# Patient Record
Sex: Female | Born: 1985 | Race: White | Hispanic: No | Smoking: Never smoker
Health system: Southern US, Community
[De-identification: ages and names within clinical notes are randomized; demographics above are authoritative.]

## PROBLEM LIST (undated history)

## (undated) DIAGNOSIS — F329 Major depressive disorder, single episode, unspecified: Secondary | ICD-10-CM

## (undated) DIAGNOSIS — N83209 Unspecified ovarian cyst, unspecified side: Secondary | ICD-10-CM

## (undated) DIAGNOSIS — M797 Fibromyalgia: Secondary | ICD-10-CM

## (undated) DIAGNOSIS — IMO0002 Reserved for concepts with insufficient information to code with codable children: Secondary | ICD-10-CM

## (undated) DIAGNOSIS — R000 Tachycardia, unspecified: Secondary | ICD-10-CM

## (undated) DIAGNOSIS — K219 Gastro-esophageal reflux disease without esophagitis: Secondary | ICD-10-CM

## (undated) DIAGNOSIS — F32A Depression, unspecified: Secondary | ICD-10-CM

## (undated) DIAGNOSIS — M549 Dorsalgia, unspecified: Secondary | ICD-10-CM

## (undated) DIAGNOSIS — M7071 Other bursitis of hip, right hip: Secondary | ICD-10-CM

## (undated) DIAGNOSIS — F419 Anxiety disorder, unspecified: Secondary | ICD-10-CM

## (undated) DIAGNOSIS — IMO0001 Reserved for inherently not codable concepts without codable children: Secondary | ICD-10-CM

## (undated) DIAGNOSIS — G8929 Other chronic pain: Secondary | ICD-10-CM

## (undated) DIAGNOSIS — M7072 Other bursitis of hip, left hip: Secondary | ICD-10-CM

## (undated) HISTORY — PX: KNEE DISLOCATION SURGERY: SHX689

## (undated) HISTORY — PX: WISDOM TOOTH EXTRACTION: SHX21

## (undated) HISTORY — PX: ADENOIDECTOMY: SUR15

## (undated) HISTORY — PX: TONSILLECTOMY: SUR1361

## (undated) HISTORY — PX: KNEE ARTHROSCOPY: SHX127

---
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
## 2006-01-07 ENCOUNTER — Emergency Department (HOSPITAL_COMMUNITY): Admission: EM | Admit: 2006-01-07 | Discharge: 2006-01-07 | Payer: Self-pay | Admitting: Emergency Medicine

## 2006-03-18 ENCOUNTER — Emergency Department (HOSPITAL_COMMUNITY): Admission: EM | Admit: 2006-03-18 | Discharge: 2006-03-18 | Payer: Self-pay | Admitting: Emergency Medicine

## 2006-03-24 ENCOUNTER — Emergency Department (HOSPITAL_COMMUNITY): Admission: EM | Admit: 2006-03-24 | Discharge: 2006-03-24 | Payer: Self-pay | Admitting: Emergency Medicine

## 2006-04-16 ENCOUNTER — Emergency Department (HOSPITAL_COMMUNITY): Admission: EM | Admit: 2006-04-16 | Discharge: 2006-04-16 | Payer: Self-pay | Admitting: Emergency Medicine

## 2007-05-12 ENCOUNTER — Emergency Department (HOSPITAL_COMMUNITY): Admission: EM | Admit: 2007-05-12 | Discharge: 2007-05-12 | Payer: Self-pay | Admitting: Emergency Medicine

## 2007-06-07 ENCOUNTER — Emergency Department (HOSPITAL_COMMUNITY): Admission: EM | Admit: 2007-06-07 | Discharge: 2007-06-07 | Payer: Self-pay | Admitting: Emergency Medicine

## 2007-07-20 ENCOUNTER — Emergency Department (HOSPITAL_COMMUNITY): Admission: EM | Admit: 2007-07-20 | Discharge: 2007-07-20 | Payer: Self-pay | Admitting: Emergency Medicine

## 2007-07-30 ENCOUNTER — Encounter: Payer: Self-pay | Admitting: Family

## 2007-09-11 ENCOUNTER — Encounter (HOSPITAL_COMMUNITY): Admission: RE | Admit: 2007-09-11 | Discharge: 2007-10-29 | Payer: Self-pay | Admitting: Family Medicine

## 2007-09-15 ENCOUNTER — Emergency Department (HOSPITAL_COMMUNITY): Admission: EM | Admit: 2007-09-15 | Discharge: 2007-09-15 | Payer: Self-pay | Admitting: Emergency Medicine

## 2007-09-16 ENCOUNTER — Emergency Department (HOSPITAL_COMMUNITY): Admission: EM | Admit: 2007-09-16 | Discharge: 2007-09-16 | Payer: Self-pay | Admitting: Emergency Medicine

## 2007-11-11 ENCOUNTER — Emergency Department (HOSPITAL_BASED_OUTPATIENT_CLINIC_OR_DEPARTMENT_OTHER): Admission: EM | Admit: 2007-11-11 | Discharge: 2007-11-11 | Payer: Self-pay | Admitting: Emergency Medicine

## 2007-11-19 ENCOUNTER — Emergency Department (HOSPITAL_COMMUNITY): Admission: EM | Admit: 2007-11-19 | Discharge: 2007-11-19 | Payer: Self-pay | Admitting: Emergency Medicine

## 2007-11-25 ENCOUNTER — Emergency Department (HOSPITAL_BASED_OUTPATIENT_CLINIC_OR_DEPARTMENT_OTHER): Admission: EM | Admit: 2007-11-25 | Discharge: 2007-11-25 | Payer: Self-pay | Admitting: Emergency Medicine

## 2007-11-29 ENCOUNTER — Emergency Department (HOSPITAL_COMMUNITY): Admission: EM | Admit: 2007-11-29 | Discharge: 2007-11-29 | Payer: Self-pay | Admitting: Emergency Medicine

## 2007-12-10 ENCOUNTER — Emergency Department (HOSPITAL_BASED_OUTPATIENT_CLINIC_OR_DEPARTMENT_OTHER): Admission: EM | Admit: 2007-12-10 | Discharge: 2007-12-10 | Payer: Self-pay | Admitting: Emergency Medicine

## 2007-12-12 ENCOUNTER — Emergency Department (HOSPITAL_COMMUNITY): Admission: EM | Admit: 2007-12-12 | Discharge: 2007-12-12 | Payer: Self-pay | Admitting: Emergency Medicine

## 2007-12-15 ENCOUNTER — Emergency Department (HOSPITAL_COMMUNITY): Admission: EM | Admit: 2007-12-15 | Discharge: 2007-12-15 | Payer: Self-pay | Admitting: Emergency Medicine

## 2007-12-18 ENCOUNTER — Emergency Department (HOSPITAL_BASED_OUTPATIENT_CLINIC_OR_DEPARTMENT_OTHER): Admission: EM | Admit: 2007-12-18 | Discharge: 2007-12-18 | Payer: Self-pay | Admitting: Emergency Medicine

## 2008-01-04 ENCOUNTER — Emergency Department (HOSPITAL_COMMUNITY): Admission: EM | Admit: 2008-01-04 | Discharge: 2008-01-04 | Payer: Self-pay | Admitting: Emergency Medicine

## 2008-04-25 ENCOUNTER — Emergency Department (HOSPITAL_BASED_OUTPATIENT_CLINIC_OR_DEPARTMENT_OTHER): Admission: EM | Admit: 2008-04-25 | Discharge: 2008-04-25 | Payer: Self-pay | Admitting: Emergency Medicine

## 2009-06-01 ENCOUNTER — Emergency Department (HOSPITAL_BASED_OUTPATIENT_CLINIC_OR_DEPARTMENT_OTHER): Admission: EM | Admit: 2009-06-01 | Discharge: 2009-06-01 | Payer: Self-pay | Admitting: Emergency Medicine

## 2009-07-11 ENCOUNTER — Emergency Department (HOSPITAL_BASED_OUTPATIENT_CLINIC_OR_DEPARTMENT_OTHER): Admission: EM | Admit: 2009-07-11 | Discharge: 2009-07-12 | Payer: Self-pay | Admitting: Emergency Medicine

## 2009-08-09 ENCOUNTER — Emergency Department (HOSPITAL_COMMUNITY): Admission: EM | Admit: 2009-08-09 | Discharge: 2009-08-09 | Payer: Self-pay | Admitting: Emergency Medicine

## 2009-08-17 ENCOUNTER — Emergency Department (HOSPITAL_COMMUNITY): Admission: EM | Admit: 2009-08-17 | Discharge: 2009-08-17 | Payer: Self-pay | Admitting: Emergency Medicine

## 2009-12-02 ENCOUNTER — Emergency Department (HOSPITAL_COMMUNITY): Admission: EM | Admit: 2009-12-02 | Discharge: 2009-12-03 | Payer: Self-pay | Admitting: Emergency Medicine

## 2009-12-14 ENCOUNTER — Emergency Department (HOSPITAL_BASED_OUTPATIENT_CLINIC_OR_DEPARTMENT_OTHER): Admission: EM | Admit: 2009-12-14 | Discharge: 2009-12-14 | Payer: Self-pay | Admitting: Emergency Medicine

## 2009-12-21 ENCOUNTER — Ambulatory Visit: Payer: Self-pay | Admitting: Radiology

## 2009-12-21 ENCOUNTER — Emergency Department (HOSPITAL_BASED_OUTPATIENT_CLINIC_OR_DEPARTMENT_OTHER)
Admission: EM | Admit: 2009-12-21 | Discharge: 2009-12-21 | Payer: Self-pay | Source: Home / Self Care | Admitting: Emergency Medicine

## 2009-12-26 ENCOUNTER — Emergency Department (HOSPITAL_BASED_OUTPATIENT_CLINIC_OR_DEPARTMENT_OTHER): Admission: EM | Admit: 2009-12-26 | Discharge: 2009-12-26 | Payer: Self-pay | Admitting: Emergency Medicine

## 2009-12-27 ENCOUNTER — Ambulatory Visit: Payer: Self-pay | Admitting: Diagnostic Radiology

## 2009-12-27 ENCOUNTER — Ambulatory Visit: Admission: RE | Admit: 2009-12-27 | Discharge: 2009-12-27 | Payer: Self-pay | Admitting: Emergency Medicine

## 2010-01-05 ENCOUNTER — Emergency Department (HOSPITAL_BASED_OUTPATIENT_CLINIC_OR_DEPARTMENT_OTHER): Admission: EM | Admit: 2010-01-05 | Discharge: 2010-01-06 | Payer: Self-pay | Admitting: Emergency Medicine

## 2010-01-18 ENCOUNTER — Emergency Department (HOSPITAL_BASED_OUTPATIENT_CLINIC_OR_DEPARTMENT_OTHER): Admission: EM | Admit: 2010-01-18 | Discharge: 2010-01-18 | Payer: Self-pay | Admitting: Emergency Medicine

## 2010-02-03 ENCOUNTER — Emergency Department (HOSPITAL_BASED_OUTPATIENT_CLINIC_OR_DEPARTMENT_OTHER)
Admission: EM | Admit: 2010-02-03 | Discharge: 2010-02-03 | Payer: Self-pay | Source: Home / Self Care | Admitting: Emergency Medicine

## 2010-02-05 ENCOUNTER — Ambulatory Visit: Payer: Self-pay | Admitting: Family

## 2010-02-05 DIAGNOSIS — E0590 Thyrotoxicosis, unspecified without thyrotoxic crisis or storm: Secondary | ICD-10-CM | POA: Insufficient documentation

## 2010-02-05 DIAGNOSIS — M5430 Sciatica, unspecified side: Secondary | ICD-10-CM

## 2010-02-05 DIAGNOSIS — F329 Major depressive disorder, single episode, unspecified: Secondary | ICD-10-CM

## 2010-02-05 DIAGNOSIS — F3289 Other specified depressive episodes: Secondary | ICD-10-CM | POA: Insufficient documentation

## 2010-02-09 ENCOUNTER — Emergency Department (HOSPITAL_BASED_OUTPATIENT_CLINIC_OR_DEPARTMENT_OTHER)
Admission: EM | Admit: 2010-02-09 | Discharge: 2010-02-09 | Payer: Self-pay | Source: Home / Self Care | Admitting: Emergency Medicine

## 2010-02-09 ENCOUNTER — Encounter: Payer: Self-pay | Admitting: Family

## 2010-02-10 ENCOUNTER — Emergency Department (HOSPITAL_COMMUNITY)
Admission: EM | Admit: 2010-02-10 | Discharge: 2010-02-10 | Payer: Self-pay | Source: Home / Self Care | Admitting: Emergency Medicine

## 2010-02-12 ENCOUNTER — Emergency Department (HOSPITAL_BASED_OUTPATIENT_CLINIC_OR_DEPARTMENT_OTHER)
Admission: EM | Admit: 2010-02-12 | Discharge: 2010-02-13 | Payer: Self-pay | Source: Home / Self Care | Admitting: Emergency Medicine

## 2010-03-10 ENCOUNTER — Telehealth: Payer: Self-pay | Admitting: Family

## 2010-03-15 ENCOUNTER — Encounter: Payer: Self-pay | Admitting: Family

## 2010-03-15 ENCOUNTER — Telehealth: Payer: Self-pay | Admitting: Family

## 2010-03-15 DIAGNOSIS — R87619 Unspecified abnormal cytological findings in specimens from cervix uteri: Secondary | ICD-10-CM | POA: Insufficient documentation

## 2010-03-17 ENCOUNTER — Encounter: Payer: Self-pay | Admitting: Family

## 2010-03-19 ENCOUNTER — Telehealth: Payer: Self-pay | Admitting: Family

## 2010-03-21 ENCOUNTER — Encounter: Payer: Self-pay | Admitting: Neurosurgery

## 2010-04-01 NOTE — Letter (Signed)
Summary: Out of Work  Adult nurse at [employer]. [address]   [place], [place] [address]   Phone: [phone]  Fax: [phone]    February 05, 2010   Employee:  Aggie Cosier    To Whom It May Concern:   For Medical reasons, please excuse the above named employee from work for the following dates:  02/04/10  If you need additional information, please feel free to contact our office.         Sincerely,    Lemont Fillers FNP

## 2010-04-01 NOTE — Letter (Signed)
Summary: Historic Patient File  Historic Patient File   Imported By: Roselle Locus 03/17/2010 08:06:08  _____________________________________________________________________  External Attachment:    Type:   Image     Comment:   External Document

## 2010-04-01 NOTE — Progress Notes (Signed)
Summary: Records received--[facility]  Phone Note Other Incoming   Summary of Call: Records received from [facility] and forwarded to Provider for review.  Initial call taken by: Mervin Kung CMA Duncan Dull),  March 10, 2010 4:08 PM

## 2010-04-01 NOTE — Progress Notes (Signed)
Summary: GYN referral  Phone Note Outgoing Call   Summary of Call: Please call patient and let her know that I have reviewed her records from   [facility] and see that she had an abnormal Pap smear in 2007.  I want to make sure that she is being followed regularly by a GYN.  If not, she needs to see one.  I will be happy to make referral.  Please let me know.  Initial call taken by: Lemont Fillers FNP,  March 15, 2010 12:26 PM  Follow-up for Phone Call        Notified pt. She states she is not currently seeing a GYN in the area. Reports last pap was done at [facility] in ?11/2008 and was told she had endometriosis. She would like a referral to a local GYN. Nicki Guadalajara Fergerson CMA (AAMA)  March 15, 2010 1:32 PM

## 2010-04-01 NOTE — Letter (Signed)
Summary: Unable to See Patient/[facility]  Unable to See Patient/[facility]   Imported By: Lanelle Bal 02/26/2010 07:51:48  _____________________________________________________________________  External Attachment:    Type:   Image     Comment:   External Document

## 2010-04-01 NOTE — Assessment & Plan Note (Signed)
Summary: new to est/mhf-- Rm 4   Vital Signs:  Patient profile:   25 year old female Height:      64 inches Weight:      118 pounds BMI:     20.33 Temp:     97.7 degrees F oral Pulse rate:   78 / minute Pulse rhythm:   regular Resp:     16 per minute BP sitting:   100 / 78  (right arm) Cuff size:   regular  Vitals Entered By: Mervin Kung CMA Duncan Dull) (February 05, 2010 11:18 AM) CC: Pt new to establish care. States she has hx of DDD, L5, S1 buldging disc by MRI 7/09 with nerve damage on the left. States she has bursitis of the left hip. Is Patient Diabetic? No Pain Assessment Patient in pain? yes        CC:  Pt new to establish care. States she has hx of DDD, L5, and S1 buldging disc by MRI 7/09 with nerve damage on the left. States she has bursitis of the left hip.Marland Kitchen  History of Present Illness: Adriana Galvan  is a 25 year old female who presents today to establish care.  She reports that she was previously following at [facility] (near [place]).  She lost her medicaid when she was 22 and has not been seeing a primary care since that time.  Her cheif complaint today is back pain.  1) Low back pain- Pt reports that symptoms started in 2008.  She reportedly had an MRI- [facility] in 2009 which noted L5/S1 bulging.   Progressively worsened.  Last few months both legs have started hurting.  Works as a Conservation officer, nature at [employer].  Mom and GM both his history of spinal problems, fusions.  2) Hyperthyroid-  Was told that she has history of hyperthyroidism.  Never treated.  She had a thyroid scan.    3) Bursitis left hip- diagnosed in November.    4)Depression- denies history of spending sprees, mania.  She has been more down lately since her husband was incarcerated.     Preventive Screening-Counseling & Management  Alcohol-Tobacco     Alcohol drinks/day: 0     Smoking Status: never  Caffeine-Diet-Exercise     Caffeine use/day: none     Does Patient  Exercise: no  Allergies (verified): 1)  ! Pcn 2)  ! Benadryl 3)  ! Zithromax 4)  ! Reglan 5)  ! Tramadol Hcl 6)  ! Hydrocodone  Past History:  Past Medical History: DDD Buldging disc L5, S1 by MRI--08/2007 Bursitis left hip--11/09 tachycardia Hx of Hyperthyroidism--untreated  Past Surgical History: right knee surgery (fractured knee cap)-- 2003,  Dr Mckinley Jewel right knee Arthroscopy-- 2005 Tonsillectomy & Addenoidectomy--2006 Wisdom teeth extraction  Family History: Mother-- bipolar (manic), congenial heart defect?, spinal fusion Father-- bipolar, treated MGM-- Titanium cage, bars in spine PGF-- heart disease Was on wellbutrin for a while Only child, no children  Social History: Occupation: Conservation officer, nature,  Married- husband is incarcerated Never Smoked Alcohol use-no Regular exercise-no Smoking Status:  never Caffeine use/day:  none Does Patient Exercise:  no  Review of Systems       Constitutional: Denies Fever ENT:  Denies nasal congestion or sore throat. Resp: Denies cough CV:  Denies Chest Pain, occasional palpitations GI:  Denies nausea or vomitting- she continues prilosec GU: Denies dysuria Lymphatic: Denies lymphadenopathy Musculoskeletal:  + sciatic pain radiates down the left leg. Skin:  Denies Rashes Psychiatric:  see HPI Neuro: +  numbness big toe on left foot.     Physical Exam  General:  Well-developed,well-nourished,in no acute distress; alert,appropriate and cooperative throughout examination Head:  Normocephalic and atraumatic without obvious abnormalities. No apparent alopecia or balding. Ears:  External ear exam shows no significant lesions or deformities.  Otoscopic examination reveals clear canals, tympanic membranes are intact bilaterally without bulging, retraction, inflammation or discharge. Hearing is grossly normal bilaterally. Neck:  No deformities, masses, or tenderness noted. Lungs:  Normal respiratory effort, chest expands  symmetrically. Lungs are clear to auscultation, no crackles or wheezes. Heart:  Normal rate and regular rhythm. S1 and S2 normal without gallop, murmur, click, rub or other extra sounds. Neurologic:  bilateral LE 5/5 strength.  bilateral patellar reflexes 3+gait normal.   Skin:  Intact without suspicious lesions or rashes Psych:  Oriented X3 and memory intact for recent and remote.  Became tearful when narcotics were not provided   Impression & Recommendations:  Problem # 1:  SCIATICA, LEFT (ICD-724.3) Patient is requesting oxycodone and Lyrica today.  I advised patient that her symptoms are consistent with a chronic pain situation and we do not treat chronic pain here.  I did adviser that I am happy to make a referral to pain management. She wishes to proceed with referral today.  I have recommended NSAIDS, PT, and referral to neurosurgery.  She tells me that she cannot afford PT or NS referral at this time. She also tells me that she has not responded to ESI's in the past.  She is hopeful that she will have insurance in the near future.   I did call her pharmacy to see is she is currently taking any narcotics from different providers and they told me that she has had percocet filled 10/31, 11/9, and 11/21 by 3 different providers (Dr. March Rummage, Dr Narda Bonds, Dr. Lorre Nick respectively).  Will request MRI and primary care records.   Her updated medication list for this problem includes:    Meloxicam 7.5 Mg Tabs (Meloxicam) ..... One tablet by mouth once daily  Orders: Pain Clinic Referral (Pain)  Problem # 2:  HYPERTHYROIDISM (ICD-242.90) Assessment: Comment Only Review of Echart notes that patient had a thyroid scan which was performed in 2009.  This was felt to be consistent with graves disease.  Will check TSH today.  If low will add on free t3/t4.   Orders: TLB-TSH (Thyroid Stimulating Hormone) (84443-TSH)  Problem # 3:  DEPRESSION (ICD-311) Assessment: Unchanged Husband was recently  incarcerated. I suspect that her depression is largely situational.  Will monitor for now.  Consider medication if symptoms worsen or do not improve.   Complete Medication List: 1)  Prilosec Otc 20 Mg Tbec (Omeprazole magnesium) .... One tablet by mouth once daily 2)  Meloxicam 7.5 Mg Tabs (Meloxicam) .... One tablet by mouth once daily  Patient Instructions: 1)  Please follow up in 1 month.  2)  Most patients (90%) with low back pain will improve with time (2-6 weeks). Keep active but avoid activities that are painful. Apply moist heat and/or ice to lower back several times a day. 3)  Please complete your lab work downstairs today. Prescriptions: MELOXICAM 7.5 MG TABS (MELOXICAM) one tablet by mouth once daily  #15 x 0   Entered and Authorized by:   Lemont Fillers FNP   Signed by:   Lemont Fillers FNP on 02/05/2010   Method used:   Electronically to  [facility]  [address] [phone]* (retail)       [address]       [place], [place]  [address]       Ph: [phone] or [phone]       Fax: [phone]   RxID:   [id number] MELOXICAM 7.5 MG TABS (MELOXICAM) one tablet by mouth once daily  #15 x 0   Entered and Authorized by:   Lemont Fillers FNP   Signed by:   Lemont Fillers FNP on 02/05/2010   Method used:   Print then Give to Patient   RxID:   [id number]    Orders Added: 1)  TLB-TSH (Thyroid Stimulating Hormone) [32951-OAC] 2)  Pain Clinic Referral [Pain] 3)  New Patient Level II [99202]   Immunization History:  Influenza Immunization History:    Influenza:  historical (11/28/2008)   Contraindications/Deferment of Procedures/Staging:    Test/Procedure: FLU VAX    Reason for deferment: patient declined   Immunization History:  Influenza Immunization History:    Influenza:  Historical (11/28/2008)  Current Allergies (reviewed today): ! PCN ! BENADRYL ! ZITHROMAX ! REGLAN ! TRAMADOL HCL ! HYDROCODONE   Preventive  Care Screening  Last Tetanus Booster:    Date:  11/28/2008    Results:  Historical   Pap Smear:    Date:  11/28/2008    Results:  normal

## 2010-04-01 NOTE — Progress Notes (Signed)
Summary: MRI from [facility] Note Call from Patient   Summary of Call: MRI received from [facility] and forwarded to Provider for review. Adriana Galvan CMA Duncan Dull)  March 19, 2010 1:48 PM

## 2010-04-01 NOTE — Miscellaneous (Signed)
  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

## 2010-05-05 ENCOUNTER — Emergency Department (HOSPITAL_BASED_OUTPATIENT_CLINIC_OR_DEPARTMENT_OTHER)
Admission: EM | Admit: 2010-05-05 | Discharge: 2010-05-05 | Disposition: A | Discharge status: Home / Self Care | Payer: Self-pay | Attending: Emergency Medicine | Admitting: Emergency Medicine

## 2010-05-05 DIAGNOSIS — G8929 Other chronic pain: Secondary | ICD-10-CM | POA: Insufficient documentation

## 2010-05-05 DIAGNOSIS — M549 Dorsalgia, unspecified: Secondary | ICD-10-CM | POA: Insufficient documentation

## 2010-05-05 DIAGNOSIS — K219 Gastro-esophageal reflux disease without esophagitis: Secondary | ICD-10-CM | POA: Insufficient documentation

## 2010-05-05 LAB — PREGNANCY, URINE: Preg Test, Ur: NEGATIVE

## 2010-05-05 LAB — URINALYSIS, ROUTINE W REFLEX MICROSCOPIC
Glucose, UA: NEGATIVE mg/dL
Nitrite: NEGATIVE
Protein, ur: NEGATIVE mg/dL
pH: 5.5 (ref 5.0–8.0)

## 2010-05-11 LAB — URINALYSIS, ROUTINE W REFLEX MICROSCOPIC
Bilirubin Urine: NEGATIVE
Nitrite: NEGATIVE
Protein, ur: NEGATIVE mg/dL
Specific Gravity, Urine: 1.018 (ref 1.005–1.030)
Urobilinogen, UA: 0.2 mg/dL (ref 0.0–1.0)

## 2010-05-11 LAB — PREGNANCY, URINE: Preg Test, Ur: NEGATIVE

## 2010-05-12 LAB — URINALYSIS, ROUTINE W REFLEX MICROSCOPIC
Bilirubin Urine: NEGATIVE
Bilirubin Urine: NEGATIVE
Glucose, UA: NEGATIVE mg/dL
Ketones, ur: NEGATIVE mg/dL
Ketones, ur: NEGATIVE mg/dL
Nitrite: NEGATIVE
Protein, ur: NEGATIVE mg/dL
Protein, ur: NEGATIVE mg/dL
Urobilinogen, UA: 0.2 mg/dL (ref 0.0–1.0)
pH: 5.5 (ref 5.0–8.0)

## 2010-05-12 LAB — DIFFERENTIAL
Basophils Relative: 1 % (ref 0–1)
Eosinophils Relative: 0 % (ref 0–5)
Lymphocytes Relative: 18 % (ref 12–46)
Lymphs Abs: 1.8 10*3/uL (ref 0.7–4.0)
Monocytes Absolute: 0.5 10*3/uL (ref 0.1–1.0)
Monocytes Absolute: 0.5 10*3/uL (ref 0.1–1.0)
Monocytes Relative: 7 % (ref 3–12)
Neutro Abs: 4.1 10*3/uL (ref 1.7–7.7)
Neutro Abs: 7.7 10*3/uL (ref 1.7–7.7)

## 2010-05-12 LAB — COMPREHENSIVE METABOLIC PANEL
AST: 18 U/L (ref 0–37)
Albumin: 4.4 g/dL (ref 3.5–5.2)
Albumin: 4.5 g/dL (ref 3.5–5.2)
Alkaline Phosphatase: 41 U/L (ref 39–117)
BUN: 11 mg/dL (ref 6–23)
BUN: 14 mg/dL (ref 6–23)
Calcium: 10.1 mg/dL (ref 8.4–10.5)
Creatinine, Ser: 0.7 mg/dL (ref 0.4–1.2)
GFR calc Af Amer: >60 mL/min (ref >60)
GFR calc non Af Amer: >60 mL/min (ref >60)
Potassium: 4.4 mEq/L (ref 3.5–5.1)
Sodium: 143 mEq/L (ref 135–145)
Total Protein: 7.6 g/dL (ref 6.0–8.3)

## 2010-05-12 LAB — CBC
HCT: 45.9 % (ref 36.0–46.0)
Hemoglobin: 15.3 g/dL — ABNORMAL HIGH (ref 12.0–15.0)
MCH: 30.9 pg (ref 26.0–34.0)
MCHC: 33.4 g/dL (ref 30.0–36.0)
MCHC: 34.6 g/dL (ref 30.0–36.0)
Platelets: 151 10*3/uL (ref 150–400)
RBC: 5.07 MIL/uL (ref 3.87–5.11)
WBC: 7 10*3/uL (ref 4.0–10.5)

## 2010-05-12 LAB — LIPASE, BLOOD: Lipase: 42 U/L (ref 23–300)

## 2010-05-12 LAB — WET PREP, GENITAL: Clue Cells Wet Prep HPF POC: NONE SEEN

## 2010-05-12 LAB — PREGNANCY, URINE: Preg Test, Ur: NEGATIVE

## 2010-05-12 LAB — URINE MICROSCOPIC-ADD ON

## 2010-05-12 LAB — GC/CHLAMYDIA PROBE AMP, GENITAL
Chlamydia, DNA Probe: NEGATIVE
GC Probe Amp, Genital: NEGATIVE

## 2010-05-17 ENCOUNTER — Telehealth: Payer: Self-pay | Admitting: Family

## 2010-05-18 LAB — URINALYSIS, ROUTINE W REFLEX MICROSCOPIC
Leukocytes, UA: NEGATIVE
Protein, ur: NEGATIVE mg/dL
Specific Gravity, Urine: 1.018 (ref 1.005–1.030)
Urobilinogen, UA: 1 mg/dL (ref 0.0–1.0)

## 2010-05-18 LAB — URINE MICROSCOPIC-ADD ON

## 2010-05-19 ENCOUNTER — Encounter: Payer: Self-pay | Admitting: Obstetrics & Gynecology

## 2010-05-20 ENCOUNTER — Encounter: Payer: Self-pay | Admitting: Obstetrics and Gynecology

## 2010-05-25 ENCOUNTER — Emergency Department (INDEPENDENT_AMBULATORY_CARE_PROVIDER_SITE_OTHER): Payer: Self-pay

## 2010-05-25 ENCOUNTER — Emergency Department (HOSPITAL_BASED_OUTPATIENT_CLINIC_OR_DEPARTMENT_OTHER)
Admission: EM | Admit: 2010-05-25 | Discharge: 2010-05-26 | Disposition: A | Discharge status: Home / Self Care | Payer: Self-pay | Attending: Emergency Medicine | Admitting: Emergency Medicine

## 2010-05-25 DIAGNOSIS — G8929 Other chronic pain: Secondary | ICD-10-CM | POA: Insufficient documentation

## 2010-05-25 DIAGNOSIS — F3289 Other specified depressive episodes: Secondary | ICD-10-CM | POA: Insufficient documentation

## 2010-05-25 DIAGNOSIS — M79609 Pain in unspecified limb: Secondary | ICD-10-CM | POA: Insufficient documentation

## 2010-05-25 DIAGNOSIS — F329 Major depressive disorder, single episode, unspecified: Secondary | ICD-10-CM | POA: Insufficient documentation

## 2010-05-25 DIAGNOSIS — K219 Gastro-esophageal reflux disease without esophagitis: Secondary | ICD-10-CM | POA: Insufficient documentation

## 2010-05-27 ENCOUNTER — Encounter: Payer: Self-pay | Admitting: Obstetrics and Gynecology

## 2010-05-27 NOTE — Progress Notes (Signed)
----   Converted from flag ---- ---- 05/17/2010 12:32 PM, Darral Dash wrote:   spoke with pt she has appt March 29   [facility]     ---- 04/26/2010 8:54 PM, Lemont Fillers FNP wrote: Could you pls check that patient had GYN apt scheduled? (and verify that she went to apt).  Thanks- Tauren Delbuono ------------------------------

## 2010-06-05 ENCOUNTER — Emergency Department (HOSPITAL_COMMUNITY)
Admission: EM | Admit: 2010-06-05 | Discharge: 2010-06-05 | Disposition: A | Discharge status: Home / Self Care | Payer: Self-pay | Attending: Emergency Medicine | Admitting: Emergency Medicine

## 2010-06-05 ENCOUNTER — Emergency Department (HOSPITAL_COMMUNITY): Payer: Self-pay

## 2010-06-05 DIAGNOSIS — K219 Gastro-esophageal reflux disease without esophagitis: Secondary | ICD-10-CM | POA: Insufficient documentation

## 2010-06-05 DIAGNOSIS — L84 Corns and callosities: Secondary | ICD-10-CM | POA: Insufficient documentation

## 2010-06-05 DIAGNOSIS — Z79899 Other long term (current) drug therapy: Secondary | ICD-10-CM | POA: Insufficient documentation

## 2010-06-05 DIAGNOSIS — M79609 Pain in unspecified limb: Secondary | ICD-10-CM | POA: Insufficient documentation

## 2010-06-05 DIAGNOSIS — F341 Dysthymic disorder: Secondary | ICD-10-CM | POA: Insufficient documentation

## 2010-06-09 ENCOUNTER — Emergency Department (HOSPITAL_COMMUNITY)
Admission: EM | Admit: 2010-06-09 | Discharge: 2010-06-09 | Disposition: A | Discharge status: Home / Self Care | Payer: Self-pay | Attending: Emergency Medicine | Admitting: Emergency Medicine

## 2010-06-09 DIAGNOSIS — M79609 Pain in unspecified limb: Secondary | ICD-10-CM | POA: Insufficient documentation

## 2010-06-09 DIAGNOSIS — F341 Dysthymic disorder: Secondary | ICD-10-CM | POA: Insufficient documentation

## 2010-06-16 ENCOUNTER — Encounter: Payer: Self-pay | Admitting: Obstetrics and Gynecology

## 2010-06-23 ENCOUNTER — Emergency Department (HOSPITAL_BASED_OUTPATIENT_CLINIC_OR_DEPARTMENT_OTHER)
Admission: EM | Admit: 2010-06-23 | Discharge: 2010-06-23 | Disposition: A | Discharge status: Home / Self Care | Payer: Self-pay | Attending: Emergency Medicine | Admitting: Emergency Medicine

## 2010-06-23 DIAGNOSIS — K219 Gastro-esophageal reflux disease without esophagitis: Secondary | ICD-10-CM | POA: Insufficient documentation

## 2010-06-23 DIAGNOSIS — F341 Dysthymic disorder: Secondary | ICD-10-CM | POA: Insufficient documentation

## 2010-06-23 DIAGNOSIS — M25559 Pain in unspecified hip: Secondary | ICD-10-CM | POA: Insufficient documentation

## 2010-06-23 DIAGNOSIS — Z79899 Other long term (current) drug therapy: Secondary | ICD-10-CM | POA: Insufficient documentation

## 2010-06-23 DIAGNOSIS — G8929 Other chronic pain: Secondary | ICD-10-CM | POA: Insufficient documentation

## 2010-07-14 ENCOUNTER — Encounter: Payer: Self-pay | Admitting: Obstetrics and Gynecology

## 2010-07-16 NOTE — Op Note (Signed)
[place]  Patient:    Adriana Galvan, Adriana Galvan Visit Number: [id number] MRN: [id number]          Service Type: DSU Location: [facility] Attending Physician:  Colbert Ewing Dictated by:   Loreta Ave, M.D. Proc. Date: 06/07/01 Admit Date:  06/07/2001                             Operative Report  PREOPERATIVE DIAGNOSIS:  Patellofemoral subluxation and recurrent dislocation, right knee.  POSTOPERATIVE DIAGNOSIS:  Patellofemoral subluxation and recurrent dislocation, right knee, with traumatic chondromalacia and inflamed medial plica.  PROCEDURES: 1. Right knee examination under anesthesia, arthroscopy, chondroplasty of    patella, excision of medial plica. 2. Open medial reefing of retinaculum.  SURGEON:  Loreta Ave, M.D.  ASSISTANT:  Arlys John D. Petrarca, P.A.-C.  ANESTHESIA:  General.  ESTIMATED BLOOD LOSS:  Minimal.  TOURNIQUET TIME:  45 minutes.  SPECIMENS:  None.  CULTURES:  None.  COMPLICATIONS:  None.  DRESSING:  Soft compressive with knee immobilizer.  DESCRIPTION OF PROCEDURE:  The patient was brought to the operating room and after adequate anesthesia had been obtained, both knees examined.  Local ligamentous laxity with subluxable patellas on both sides.  Did not see significant tether laterally.  On the right she could be dislocated laterally with marked attenuation of stretching medial retinaculum.  Tourniquet applied right leg.  Prepped and draped in the usual sterile fashion.  Exsanguinated with elevation and Esmarch, tourniquet inflated to 350 mmHg.  Anteromedial and anterolateral portals.  Arthroscope introduced and knee inspected.  Some grade 2 changes on the patella debrided.  Inflamed medial plica resected.  Marked lateral tracking and lateral subluxation but without significant tethering laterally.  Medial meniscus, medial compartment, lateral meniscus, lateral compartment, cruciate ligaments normal.   After chondroplasty and plica excision, instruments and fluid removed.  Incision made medial to the patella. Skin and subcutaneous tissue divided.  Superficial retinaculum incised next to the patella and retracted.  The deep retinaculum was then taken down from above the patella down to the inferior aspect near the patellar border.   It was then mobilized and reefed 1 cm up on top of the patella to tighten the medial retinaculum and reinforce it.  The retinaculum was then oversewn with Vicryl to do a double layer of reefing.  When this was complete, tracking assessed visually and arthroscopically.  It was markedly improved with normal tracking.  Did not tether up laterally to require lateral release.  I could bring her well past 90 degrees of flexion without undue tension on the reefing, confirming a relatively anatomic positioning.  When this was confirmed, the instruments and fluid were removed.  Portals, incision, and knee injected with Marcaine.  Portals closed with 4-0 nylon.  Incision closed with subcutaneous and subcuticular Vicryl.  Sterile compressive dressing and knee immobilizer applied.  Anesthesia reversed.  Brought to the recovery room. Tolerated the surgery well with no complications. Dictated by:   Loreta Ave, M.D. Attending Physician:  Colbert Ewing DD:  06/07/01 TD:  06/08/01 Job: [id number]

## 2010-07-16 NOTE — Discharge Summary (Signed)
NAME:  Adriana Galvan, Adriana Galvan NO.:  [id number]   MEDICAL RECORD NO.:  [id number]                   PATIENT TYPE:  EMS   LOCATION:  ED                                   FACILITY:  [facility]   PHYSICIAN:  Sheppard Penton. Stacie Acres, M.D.               DATE OF BIRTH:  10/27/1985   DATE OF ADMISSION:  08/06/2003  DATE OF DISCHARGE:                                 DISCHARGE SUMMARY   TIME OF EVALUATION:  2:20 a.m.   COMPLAINT:  Possible medication reaction.   A 25 year old white female recently had arthroscopic knee surgery, took a  Percocet at 11:30 p.m. last night.  Around 12 a.m. developed a sensation of  dizziness, some nausea and not feeling well, some tremors.  Felt lightheaded  and numb all over.  No nausea or vomiting, no abdominal pain or chest pain,  no shortness of breath, feels somewhat better right now.  Still feels  numbness in both arms.  No prior history of the same and nothing that made  it better or worse.   PAST MEDICAL HISTORY:  Recent knee surgery - Dr. Eulah Pont.   MEDICATIONS:  Zyrtec, Percocet, Vicodin, and Wellbutrin which she just  began, had no Wellbutrin for some time, started that as well yesterday.   FAMILY HISTORY:  No chronic diseases.   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   REVIEW OF SYSTEMS:  Other than above, all else is negative.   PHYSICAL EXAMINATION:  Nurses notes are reviewed.  VITAL SIGNS:  Vital signs reviewed.  Temperature 97.2, heart rate 94.  Blood  pressure 98/62, repeated 108/64, repeated 119/67.  GENERAL:  Awake, alert, cooperative, and 5.  Both parents in the room with  her.  Memory, affect, judgment appropriate for age.  Well-developed, well-  nourished, no apparent distress.  HEENT:  Pupils equal, round, and react.  No nystagmus.  Speech clear.  Not  affected by respiratory effort.  No cyanosis.  NECK:  Supple, no JVD, no bruits.  Trachea midline.  HEART:  Regular rate and rhythm without murmurs or  gallops.  LUNGS:  Clear without wheezing, rales or rhonchi.  Chest wall not tender to  palpation.  Good respiratory excursion.  ABDOMEN:  Soft, nontender.  No guarding or rebound.  No peritoneal signs.  SKIN:  Warm and dry.  No rash, no lesions.  EXTREMITIES:  Symmetrical.  No calf tenderness, no pitting edema, and recent  right knee surgery noted with minimal redness and swelling.  NEUROLOGIC:  Grips equal bilaterally.  Cerebellar:  Hand slaps intact  bilaterally.  Dorsiflexor and plantar flexor equal bilaterally.  Strength  equal in all four.  Normal cognitive function.   I discussed with the parents at the bed.  The symptoms sound more like an  adverse reaction to narcotics with the possible addition of the recent use  of Wellbutrin.   PLAN:  Stay on ibuprofen for the next 24 hours.  Use Vicodin and not the  Percocet if needed.  Blood glucose in the ED 108.   DIAGNOSIS:  Mild drug reaction to narcotics, cannot rule out adverse  reaction to recent use of Wellbutrin.                                               Sheppard Penton. Stacie Acres, M.D.    NMM/MEDQ  D:  08/06/2003  T:  08/06/2003  Job:  [id number]

## 2010-07-23 ENCOUNTER — Emergency Department (HOSPITAL_COMMUNITY)
Admission: EM | Admit: 2010-07-23 | Discharge: 2010-07-23 | Disposition: A | Discharge status: Home / Self Care | Payer: Self-pay | Attending: Emergency Medicine | Admitting: Emergency Medicine

## 2010-07-23 DIAGNOSIS — X58XXXA Exposure to other specified factors, initial encounter: Secondary | ICD-10-CM | POA: Insufficient documentation

## 2010-07-23 DIAGNOSIS — M25569 Pain in unspecified knee: Secondary | ICD-10-CM | POA: Insufficient documentation

## 2010-07-24 ENCOUNTER — Emergency Department (HOSPITAL_BASED_OUTPATIENT_CLINIC_OR_DEPARTMENT_OTHER)
Admission: EM | Admit: 2010-07-24 | Discharge: 2010-07-24 | Disposition: A | Discharge status: Home / Self Care | Payer: Self-pay | Attending: Emergency Medicine | Admitting: Emergency Medicine

## 2010-07-24 DIAGNOSIS — L509 Urticaria, unspecified: Secondary | ICD-10-CM | POA: Insufficient documentation

## 2010-07-24 DIAGNOSIS — G8929 Other chronic pain: Secondary | ICD-10-CM | POA: Insufficient documentation

## 2010-07-24 DIAGNOSIS — Z79899 Other long term (current) drug therapy: Secondary | ICD-10-CM | POA: Insufficient documentation

## 2010-07-27 ENCOUNTER — Other Ambulatory Visit (HOSPITAL_COMMUNITY): Payer: Self-pay | Admitting: Orthopedic Surgery

## 2010-07-27 DIAGNOSIS — M25561 Pain in right knee: Secondary | ICD-10-CM

## 2010-07-28 ENCOUNTER — Ambulatory Visit (HOSPITAL_COMMUNITY)
Admission: RE | Admit: 2010-07-28 | Discharge: 2010-07-28 | Disposition: A | Discharge status: Home / Self Care | Payer: Self-pay | Source: Ambulatory Visit | Attending: Orthopedic Surgery | Admitting: Orthopedic Surgery

## 2010-07-28 DIAGNOSIS — M25561 Pain in right knee: Secondary | ICD-10-CM

## 2010-07-28 DIAGNOSIS — M25569 Pain in unspecified knee: Secondary | ICD-10-CM | POA: Insufficient documentation

## 2010-08-21 ENCOUNTER — Emergency Department (HOSPITAL_COMMUNITY)
Admission: EM | Admit: 2010-08-21 | Discharge: 2010-08-21 | Disposition: A | Discharge status: Home / Self Care | Payer: Self-pay | Attending: Emergency Medicine | Admitting: Emergency Medicine

## 2010-08-21 DIAGNOSIS — F341 Dysthymic disorder: Secondary | ICD-10-CM | POA: Insufficient documentation

## 2010-08-21 DIAGNOSIS — G8929 Other chronic pain: Secondary | ICD-10-CM | POA: Insufficient documentation

## 2010-08-21 DIAGNOSIS — Z79899 Other long term (current) drug therapy: Secondary | ICD-10-CM | POA: Insufficient documentation

## 2010-08-21 DIAGNOSIS — M25569 Pain in unspecified knee: Secondary | ICD-10-CM | POA: Insufficient documentation

## 2010-08-21 DIAGNOSIS — K219 Gastro-esophageal reflux disease without esophagitis: Secondary | ICD-10-CM | POA: Insufficient documentation

## 2010-08-26 ENCOUNTER — Emergency Department (HOSPITAL_COMMUNITY)
Admission: EM | Admit: 2010-08-26 | Discharge: 2010-08-27 | Disposition: A | Discharge status: Home / Self Care | Payer: Self-pay | Attending: Emergency Medicine | Admitting: Emergency Medicine

## 2010-08-26 DIAGNOSIS — M25569 Pain in unspecified knee: Secondary | ICD-10-CM | POA: Insufficient documentation

## 2010-08-26 DIAGNOSIS — Z9889 Other specified postprocedural states: Secondary | ICD-10-CM | POA: Insufficient documentation

## 2010-08-26 DIAGNOSIS — G8929 Other chronic pain: Secondary | ICD-10-CM | POA: Insufficient documentation

## 2010-09-04 ENCOUNTER — Emergency Department (HOSPITAL_COMMUNITY): Payer: Self-pay

## 2010-09-04 ENCOUNTER — Emergency Department (HOSPITAL_COMMUNITY)
Admission: EM | Admit: 2010-09-04 | Discharge: 2010-09-04 | Disposition: A | Discharge status: Home / Self Care | Payer: Self-pay | Attending: Emergency Medicine | Admitting: Emergency Medicine

## 2010-09-04 DIAGNOSIS — R05 Cough: Secondary | ICD-10-CM | POA: Insufficient documentation

## 2010-09-04 DIAGNOSIS — R059 Cough, unspecified: Secondary | ICD-10-CM | POA: Insufficient documentation

## 2010-09-04 DIAGNOSIS — H9209 Otalgia, unspecified ear: Secondary | ICD-10-CM | POA: Insufficient documentation

## 2010-09-04 DIAGNOSIS — J069 Acute upper respiratory infection, unspecified: Secondary | ICD-10-CM | POA: Insufficient documentation

## 2010-09-09 ENCOUNTER — Encounter: Payer: Self-pay | Admitting: *Deleted

## 2010-09-09 ENCOUNTER — Emergency Department (HOSPITAL_BASED_OUTPATIENT_CLINIC_OR_DEPARTMENT_OTHER)
Admission: EM | Admit: 2010-09-09 | Discharge: 2010-09-09 | Disposition: A | Discharge status: Home / Self Care | Payer: Self-pay | Attending: Emergency Medicine | Admitting: Emergency Medicine

## 2010-09-09 DIAGNOSIS — R110 Nausea: Secondary | ICD-10-CM

## 2010-09-09 DIAGNOSIS — F341 Dysthymic disorder: Secondary | ICD-10-CM | POA: Insufficient documentation

## 2010-09-09 DIAGNOSIS — E860 Dehydration: Secondary | ICD-10-CM | POA: Insufficient documentation

## 2010-09-09 DIAGNOSIS — R112 Nausea with vomiting, unspecified: Secondary | ICD-10-CM | POA: Insufficient documentation

## 2010-09-09 HISTORY — DX: Reserved for inherently not codable concepts without codable children: IMO0001

## 2010-09-09 HISTORY — DX: Major depressive disorder, single episode, unspecified: F32.9

## 2010-09-09 HISTORY — DX: Gastro-esophageal reflux disease without esophagitis: K21.9

## 2010-09-09 HISTORY — DX: Tachycardia, unspecified: R00.0

## 2010-09-09 HISTORY — DX: Anxiety disorder, unspecified: F41.9

## 2010-09-09 HISTORY — DX: Depression, unspecified: F32.A

## 2010-09-09 LAB — BASIC METABOLIC PANEL WITH GFR
BUN: 11 mg/dL (ref 6–23)
CO2: 22 meq/L (ref 19–32)
Calcium: 9.8 mg/dL (ref 8.4–10.5)
Chloride: 102 meq/L (ref 96–112)
Creatinine, Ser: 0.6 mg/dL (ref 0.50–1.10)
GFR calc Af Amer: >60 mL/min
GFR calc non Af Amer: >60 mL/min
Glucose, Bld: 93 mg/dL (ref 70–99)
Potassium: 3.7 meq/L (ref 3.5–5.1)
Sodium: 137 meq/L (ref 135–145)

## 2010-09-09 LAB — URINALYSIS, ROUTINE W REFLEX MICROSCOPIC
Glucose, UA: NEGATIVE mg/dL
Hgb urine dipstick: NEGATIVE
Protein, ur: NEGATIVE mg/dL
Specific Gravity, Urine: 1.026 (ref 1.005–1.030)
pH: 6 (ref 5.0–8.0)

## 2010-09-09 LAB — CBC
HCT: 40.3 % (ref 36.0–46.0)
Hemoglobin: 13.9 g/dL (ref 12.0–15.0)
MCH: 30 pg (ref 26.0–34.0)
MCHC: 34.5 g/dL (ref 30.0–36.0)
MCV: 86.9 fL (ref 78.0–100.0)
Platelets: 198 K/uL (ref 150–400)
RBC: 4.64 MIL/uL (ref 3.87–5.11)
RDW: 12.9 % (ref 11.5–15.5)
WBC: 9.8 K/uL (ref 4.0–10.5)

## 2010-09-09 LAB — DIFFERENTIAL
Basophils Absolute: 0 K/uL (ref 0.0–0.1)
Basophils Relative: 0 % (ref 0–1)
Eosinophils Absolute: 0 K/uL (ref 0.0–0.7)
Eosinophils Relative: 0 % (ref 0–5)
Lymphocytes Relative: 14 % (ref 12–46)
Lymphs Abs: 1.4 K/uL (ref 0.7–4.0)
Monocytes Absolute: 0.4 K/uL (ref 0.1–1.0)
Monocytes Relative: 5 % (ref 3–12)
Neutro Abs: 8 K/uL — ABNORMAL HIGH (ref 1.7–7.7)
Neutrophils Relative %: 81 % — ABNORMAL HIGH (ref 43–77)

## 2010-09-09 LAB — URINE MICROSCOPIC-ADD ON

## 2010-09-09 MED ORDER — AMOXICILLIN 500 MG PO CAPS: 500.0000 mg | ORAL_CAPSULE | Freq: Three times a day (TID) | ORAL | Status: AC

## 2010-09-09 MED ORDER — SODIUM CHLORIDE 0.9 % IV SOLN
Freq: Once | INTRAVENOUS | Status: AC
  Administered 2010-09-09: 2000 mL via INTRAVENOUS
  Administered 2010-09-09: 19:00:00 via INTRAVENOUS

## 2010-09-09 MED ORDER — ONDANSETRON HCL 4 MG/2ML IJ SOLN
4.0000 mg | Freq: Once | INTRAMUSCULAR | Status: AC
  Administered 2010-09-09: 4 mg via INTRAVENOUS
  Filled 2010-09-09: qty 2

## 2010-09-09 MED ORDER — PROMETHAZINE HCL 25 MG PO TABS: 25.0000 mg | ORAL_TABLET | Freq: Four times a day (QID) | ORAL | Status: AC | PRN

## 2010-09-09 NOTE — Discharge Instructions (Signed)
Your blood work and urinalysis showed that you were dehydrated but were no other significant findings. Please use the Phenergan for nausea. I called the phone numbers listed for [facility]up appointments [facility].

## 2010-09-09 NOTE — ED Provider Notes (Signed)
History     Chief Complaint  Patient presents with  . Nausea    Nausea and vomiting started 2 days ago.     HPI Comments: Patient is a 25 year old female who presents with nausea and vomiting which has been ongoing for 4 days. The symptoms are gradually getting worse. She has been seen recently at [facility] long emergency department for a cough had a chest x-ray which was normal and has been placed on some antitussives. She has had a persistent cough despite this intervention. She denies fever swelling chest pain shortness of breath abdominal pain, back pain, neck pain, headaches, blurred vision, rash, swelling. She has been treated with over-the-counter antitussive medicines without any improvement and continues to cough. Nausea and vomiting is persistent, nothing makes it better or worse, it is gradually worsening. She notes her urine today has been dark yellow. She has no sick contacts though her husband presents today with a rash on his skin.   Past Medical History  Diagnosis Date  . Tachycardia   . Reflux   . Anxiety   . Depression     Past Surgical History  Procedure Date  . Knee dislocation surgery   . Knee arthroscopy   . Tonsillectomy   . Wisdom tooth extraction     History reviewed. No pertinent family history.  History  Substance Use Topics  . Smoking status: Not on file  . Smokeless tobacco: Not on file  . Alcohol Use: No    OB History    Grav Para Term Preterm Abortions TAB SAB Ect Mult Living                  Review of Systems  All other systems reviewed and are negative.    Physical Exam  BP 128/93  Pulse 93  Temp(Src) 98.6 F (37 C) (Oral)  Resp 20  Ht 5\' 4"  (1.626 m)  Wt 123 lb (55.792 kg)  BMI 21.11 kg/m2  SpO2 100%  Physical Exam  Constitutional: She appears well-developed and well-nourished. No distress.  HENT:  Head: Normocephalic and atraumatic.  Mouth/Throat: No oropharyngeal exudate.       Dry mucous membranes  Eyes: Conjunctivae  and EOM are normal. Pupils are equal, round, and reactive to light. Right eye exhibits no discharge. Left eye exhibits no discharge. No scleral icterus.  Neck: Normal range of motion. Neck supple. No JVD present. No thyromegaly present.  Cardiovascular: Regular rhythm, normal heart sounds and intact distal pulses.  Exam reveals no gallop and no friction rub.   No murmur heard.      Sinus tachycardia of 100 beats per minute  Pulmonary/Chest: Effort normal and breath sounds normal. No respiratory distress. She has no wheezes. She has no rales.  Abdominal: Soft. Bowel sounds are normal. She exhibits no distension and no mass. There is no tenderness.  Musculoskeletal: Normal range of motion. She exhibits no edema and no tenderness.  Lymphadenopathy:    She has no cervical adenopathy.  Neurological: She is alert. Coordination normal.  Skin: Skin is warm and dry. No rash noted. She is not diaphoretic. No erythema.  Psychiatric: She has a normal mood and affect. Her behavior is normal.    ED Course  Procedures  MDM Patient appears in no distress she has a mild sinus tachycardia. She has been diagnosed with tachycardia in the past which is treated with a beta blocker. Vital signs overall appear normal she is afebrile she has clear lungs to auscultation and an  oxygen saturation of 100% on room air. As of her dehydrated appearance by mucous membranes and story consistent with gradual systemic dehydration, I will get a basic metabolic panel a CBC a urinalysis and give her 2 L of normal saline IV and also treat her with anti-emetics including Zofran IV  Blood work reviewed including complete blood count and basic metabolic panel and urinalysis. It shows that she has been dehydrated with ketones in her urine. Patient is feeling better after 2 L of normal saline, and antiemetics. Will discharge home with prescription for Phenergan and followup with local physician.    Vida Roller, MD 09/09/10 2040

## 2010-09-09 NOTE — ED Notes (Signed)
Pt states she has had nausea and vomiting for 2 days.  Was seen last week at [facility] ED with upper respiratory infection.

## 2010-09-22 ENCOUNTER — Emergency Department (HOSPITAL_BASED_OUTPATIENT_CLINIC_OR_DEPARTMENT_OTHER)
Admission: EM | Admit: 2010-09-22 | Discharge: 2010-09-22 | Discharge status: Against Medical Advice | Payer: Self-pay | Attending: Emergency Medicine | Admitting: Emergency Medicine

## 2010-09-22 DIAGNOSIS — Z0389 Encounter for observation for other suspected diseases and conditions ruled out: Secondary | ICD-10-CM | POA: Insufficient documentation

## 2010-09-22 NOTE — ED Notes (Signed)
Pt entered triage, states if [facility] PA is here today she will leave. Verified that she was  Here today and pt and family left. Pt alert oriented and amb.

## 2010-10-16 ENCOUNTER — Emergency Department (HOSPITAL_COMMUNITY)
Admission: EM | Admit: 2010-10-16 | Discharge: 2010-10-17 | Disposition: A | Discharge status: Home / Self Care | Payer: Self-pay | Attending: Emergency Medicine | Admitting: Emergency Medicine

## 2010-10-16 DIAGNOSIS — M25569 Pain in unspecified knee: Secondary | ICD-10-CM | POA: Insufficient documentation

## 2010-10-16 DIAGNOSIS — M79609 Pain in unspecified limb: Secondary | ICD-10-CM | POA: Insufficient documentation

## 2010-10-16 DIAGNOSIS — K219 Gastro-esophageal reflux disease without esophagitis: Secondary | ICD-10-CM | POA: Insufficient documentation

## 2010-10-16 DIAGNOSIS — F341 Dysthymic disorder: Secondary | ICD-10-CM | POA: Insufficient documentation

## 2010-10-16 DIAGNOSIS — M545 Low back pain, unspecified: Secondary | ICD-10-CM | POA: Insufficient documentation

## 2010-10-16 DIAGNOSIS — Z79899 Other long term (current) drug therapy: Secondary | ICD-10-CM | POA: Insufficient documentation

## 2010-10-16 DIAGNOSIS — M25559 Pain in unspecified hip: Secondary | ICD-10-CM | POA: Insufficient documentation

## 2010-10-17 ENCOUNTER — Emergency Department (HOSPITAL_COMMUNITY): Payer: Self-pay

## 2010-10-17 LAB — URINALYSIS, ROUTINE W REFLEX MICROSCOPIC
Glucose, UA: NEGATIVE mg/dL
Ketones, ur: >80 mg/dL — AB
Leukocytes, UA: NEGATIVE
Nitrite: NEGATIVE
Protein, ur: NEGATIVE mg/dL
Urobilinogen, UA: 1 mg/dL (ref 0.0–1.0)

## 2010-10-17 LAB — POCT PREGNANCY, URINE: Preg Test, Ur: NEGATIVE

## 2010-11-10 ENCOUNTER — Encounter (HOSPITAL_BASED_OUTPATIENT_CLINIC_OR_DEPARTMENT_OTHER): Payer: Self-pay | Admitting: *Deleted

## 2010-11-10 ENCOUNTER — Emergency Department (HOSPITAL_BASED_OUTPATIENT_CLINIC_OR_DEPARTMENT_OTHER)
Admission: EM | Admit: 2010-11-10 | Discharge: 2010-11-10 | Disposition: A | Discharge status: Home / Self Care | Payer: Self-pay | Attending: Emergency Medicine | Admitting: Emergency Medicine

## 2010-11-10 DIAGNOSIS — F341 Dysthymic disorder: Secondary | ICD-10-CM | POA: Insufficient documentation

## 2010-11-10 DIAGNOSIS — R6884 Jaw pain: Secondary | ICD-10-CM | POA: Insufficient documentation

## 2010-11-10 MED ORDER — OXYCODONE-ACETAMINOPHEN 5-325 MG PO TABS: 1.0000 | ORAL_TABLET | Freq: Three times a day (TID) | ORAL | Status: AC | PRN

## 2010-11-10 MED ORDER — AMOXICILLIN 500 MG PO CAPS: 500.0000 mg | ORAL_CAPSULE | Freq: Three times a day (TID) | ORAL | Status: AC

## 2010-11-10 MED ORDER — OXYCODONE-ACETAMINOPHEN 5-325 MG PO TABS
1.0000 | ORAL_TABLET | Freq: Once | ORAL | Status: AC
  Administered 2010-11-10: 1 via ORAL
  Filled 2010-11-10: qty 1

## 2010-11-10 NOTE — ED Notes (Signed)
Pt states her left jaw started hurting 3 days ago. Denies chewing at the time of onset. Denies recent toothaches. States pain worsens with opening mouth and chewing.

## 2010-11-10 NOTE — ED Provider Notes (Signed)
History     CSN: [id number] Arrival date & time: 11/10/2010  8:15 PM Pt seen at 2042 Chief Complaint  Patient presents with  . Jaw Pain   Patient is a 25 y.o. female presenting with tooth pain. The history is provided by the patient.  Dental PainPrimary symptoms do not include fever. Primary symptoms comment: left jaw pain The symptoms began 3 to 5 days ago. The symptoms are worsening. The symptoms occur constantly.  Additional symptoms include: jaw pain. Additional symptoms do not include: gum swelling, trismus, facial swelling, trouble swallowing, pain with swallowing, drooling, ear pain, hearing loss, swollen glands and fatigue.  pt reports jaw pain in left side of face No trauma No difficulty swallowing No fever She is not sure if her teeth are hurting   Past Medical History  Diagnosis Date  . Tachycardia   . Reflux   . Anxiety   . Depression     Past Surgical History  Procedure Date  . Knee dislocation surgery   . Knee arthroscopy   . Tonsillectomy   . Wisdom tooth extraction     No family history on file.  History  Substance Use Topics  . Smoking status: Never Smoker   . Smokeless tobacco: Not on file  . Alcohol Use: No    OB History    Grav Para Term Preterm Abortions TAB SAB Ect Mult Living                  Review of Systems  Constitutional: Negative for fever and fatigue.  HENT: Negative for hearing loss, ear pain, facial swelling, drooling and trouble swallowing.     Physical Exam  BP 123/84  Pulse 100  Temp(Src) 98.3 F (36.8 C) (Oral)  Resp 18  Ht 5\' 4"  (1.626 m)  Wt 120 lb (54.432 kg)  BMI 20.60 kg/m2  SpO2 100%  LMP 09/14/2010  Physical Exam  CONSTITUTIONAL: Well developed/well nourished HEAD AND FACE: Normocephalic/atraumatic EYES: EOMI/PERRL ENMT: Mucous membranes moist, no trismus, uvula midline, no dental abscess noted.  No malocclusion.  No bruising/swelling/erythema noted to face.   Ears symmetric  Left TM/right TM  normal No mastoid tenderness Tender to left mandible at the angle NECK: supple no meningeal signs CV: S1/S2 noted, no murmurs/rubs/gallops noted LUNGS: Lungs are clear to auscultation bilaterally, no apparent distress ABDOMEN: soft, nontender, no rebound or guarding NEURO: Pt is awake/alert, moves all extremitiesx4 Facies symmetric EXTREMITIES: pulses normal, full ROM SKIN: warm, color normal PSYCH: anxious   ED Course  Procedures  MDM Nursing notes reviewed and considered in documentation  Pt reports she can take amox Could be referred dental pain, will start abx, pain meds and dental/OMFS followup      Joya Gaskins, MD 11/10/10 2124

## 2010-11-15 ENCOUNTER — Encounter (HOSPITAL_COMMUNITY): Payer: Self-pay | Admitting: Radiology

## 2010-11-15 ENCOUNTER — Emergency Department (HOSPITAL_COMMUNITY): Payer: Self-pay

## 2010-11-15 ENCOUNTER — Emergency Department (HOSPITAL_COMMUNITY)
Admission: EM | Admit: 2010-11-15 | Discharge: 2010-11-15 | Disposition: A | Discharge status: Home / Self Care | Payer: Self-pay | Attending: Emergency Medicine | Admitting: Emergency Medicine

## 2010-11-15 DIAGNOSIS — M542 Cervicalgia: Secondary | ICD-10-CM | POA: Insufficient documentation

## 2010-11-15 DIAGNOSIS — R6884 Jaw pain: Secondary | ICD-10-CM | POA: Insufficient documentation

## 2010-11-15 MED ORDER — IOHEXOL 300 MG/ML  SOLN
80.0000 mL | Freq: Once | INTRAMUSCULAR | Status: AC | PRN
  Administered 2010-11-15: 80 mL via INTRAVENOUS

## 2010-12-25 ENCOUNTER — Emergency Department (HOSPITAL_COMMUNITY)
Admission: EM | Admit: 2010-12-25 | Discharge: 2010-12-25 | Disposition: A | Discharge status: Home / Self Care | Payer: Self-pay | Attending: Emergency Medicine | Admitting: Emergency Medicine

## 2010-12-25 DIAGNOSIS — M25559 Pain in unspecified hip: Secondary | ICD-10-CM | POA: Insufficient documentation

## 2010-12-25 DIAGNOSIS — IMO0001 Reserved for inherently not codable concepts without codable children: Secondary | ICD-10-CM | POA: Insufficient documentation

## 2010-12-25 DIAGNOSIS — K219 Gastro-esophageal reflux disease without esophagitis: Secondary | ICD-10-CM | POA: Insufficient documentation

## 2010-12-25 DIAGNOSIS — Z79899 Other long term (current) drug therapy: Secondary | ICD-10-CM | POA: Insufficient documentation

## 2010-12-25 DIAGNOSIS — F341 Dysthymic disorder: Secondary | ICD-10-CM | POA: Insufficient documentation

## 2010-12-25 DIAGNOSIS — M79609 Pain in unspecified limb: Secondary | ICD-10-CM | POA: Insufficient documentation

## 2011-02-19 ENCOUNTER — Emergency Department (HOSPITAL_COMMUNITY)
Admission: EM | Admit: 2011-02-19 | Discharge: 2011-02-19 | Discharge status: Against Medical Advice | Payer: Self-pay | Attending: Emergency Medicine | Admitting: Emergency Medicine

## 2011-02-19 ENCOUNTER — Encounter (HOSPITAL_COMMUNITY): Payer: Self-pay | Admitting: Emergency Medicine

## 2011-02-19 ENCOUNTER — Emergency Department (HOSPITAL_BASED_OUTPATIENT_CLINIC_OR_DEPARTMENT_OTHER)
Admission: EM | Admit: 2011-02-19 | Discharge: 2011-02-19 | Disposition: A | Discharge status: Home / Self Care | Payer: Self-pay | Attending: Emergency Medicine | Admitting: Emergency Medicine

## 2011-02-19 ENCOUNTER — Encounter (HOSPITAL_BASED_OUTPATIENT_CLINIC_OR_DEPARTMENT_OTHER): Payer: Self-pay | Admitting: *Deleted

## 2011-02-19 DIAGNOSIS — R109 Unspecified abdominal pain: Secondary | ICD-10-CM | POA: Insufficient documentation

## 2011-02-19 DIAGNOSIS — N949 Unspecified condition associated with female genital organs and menstrual cycle: Secondary | ICD-10-CM | POA: Insufficient documentation

## 2011-02-19 DIAGNOSIS — R102 Pelvic and perineal pain: Secondary | ICD-10-CM

## 2011-02-19 DIAGNOSIS — F341 Dysthymic disorder: Secondary | ICD-10-CM | POA: Insufficient documentation

## 2011-02-19 DIAGNOSIS — Z79899 Other long term (current) drug therapy: Secondary | ICD-10-CM | POA: Insufficient documentation

## 2011-02-19 LAB — DIFFERENTIAL
Basophils Relative: 0 % (ref 0–1)
Eosinophils Absolute: 0 10*3/uL (ref 0.0–0.7)
Eosinophils Relative: 0 % (ref 0–5)
Lymphs Abs: 1.8 10*3/uL (ref 0.7–4.0)
Neutrophils Relative %: 78 % — ABNORMAL HIGH (ref 43–77)

## 2011-02-19 LAB — URINALYSIS, ROUTINE W REFLEX MICROSCOPIC
Bilirubin Urine: NEGATIVE
Hgb urine dipstick: NEGATIVE
Ketones, ur: NEGATIVE mg/dL
Nitrite: NEGATIVE
Protein, ur: NEGATIVE mg/dL
Specific Gravity, Urine: 1.023 (ref 1.005–1.030)
Urobilinogen, UA: 1 mg/dL (ref 0.0–1.0)

## 2011-02-19 LAB — BASIC METABOLIC PANEL
Calcium: 9.9 mg/dL (ref 8.4–10.5)
GFR calc Af Amer: >90 mL/min (ref >90)
GFR calc non Af Amer: 89 mL/min — ABNORMAL LOW (ref >90)
Glucose, Bld: 98 mg/dL (ref 70–99)
Potassium: 3.8 mEq/L (ref 3.5–5.1)
Sodium: 139 mEq/L (ref 135–145)

## 2011-02-19 LAB — PREGNANCY, URINE: Preg Test, Ur: NEGATIVE

## 2011-02-19 LAB — CBC
MCH: 29.6 pg (ref 26.0–34.0)
MCHC: 34 g/dL (ref 30.0–36.0)
MCV: 87 fL (ref 78.0–100.0)
Platelets: 195 10*3/uL (ref 150–400)
RDW: 13 % (ref 11.5–15.5)

## 2011-02-19 MED ORDER — FENTANYL CITRATE 0.05 MG/ML IJ SOLN
INTRAMUSCULAR | Status: AC
  Filled 2011-02-19: qty 2

## 2011-02-19 MED ORDER — MORPHINE SULFATE 4 MG/ML IJ SOLN
4.0000 mg | Freq: Once | INTRAMUSCULAR | Status: AC
  Administered 2011-02-19: 4 mg via INTRAVENOUS
  Filled 2011-02-19: qty 1

## 2011-02-19 MED ORDER — FENTANYL CITRATE 0.05 MG/ML IJ SOLN: 50.0000 ug | Freq: Once | INTRAMUSCULAR | Status: DC

## 2011-02-19 MED ORDER — OXYCODONE-ACETAMINOPHEN 5-325 MG PO TABS: 2.0000 | ORAL_TABLET | ORAL | Status: AC | PRN

## 2011-02-19 NOTE — ED Provider Notes (Signed)
History   This chart was scribed for Rolan Bucco, MD scribed by Magnus Sinning. The patient was seen in room MH08/MH08   CSN: [id number]  Arrival date & time 02/19/11  4098   First MD Initiated Contact with Patient 02/19/11 2134      Chief Complaint  Patient presents with  . Abdominal Pain   (Consider location/radiation/quality/duration/timing/severity/associated sxs/prior treatment) HPI Adriana Galvan is a 25 y.o. female who presents to the Emergency Department complaining of gradually worsening abdominal pain onset yesterday afternoon w/ associated brown vaginal discharge, vaginal bleeding,but no nausea, vomiting, or fever. Pt states that her abd pain is worsened by movement and reports that she has experienced similar abdominal pain before and was seen and told that it was severe menstrual cramps. Pt is currently taking Seasonale and reports experiencing some irregularities with her menstruation, different from when she was on Nuvaring. Pt reports that last menstrual period was light and not associated with severe cramping.   PCP: [facility] Past Medical History  Diagnosis Date  . Tachycardia   . Reflux   . Anxiety   . Depression     Past Surgical History  Procedure Date  . Knee dislocation surgery   . Knee arthroscopy   . Wisdom tooth extraction   . Tonsillectomy   . Adenoidectomy     No family history on file.  History  Substance Use Topics  . Smoking status: Never Smoker   . Smokeless tobacco: Never Used  . Alcohol Use: No    OB History    Grav Para Term Preterm Abortions TAB SAB Ect Mult Living                  Review of Systems  Constitutional: Negative for fever.  Gastrointestinal: Positive for abdominal pain. Negative for nausea and vomiting.  Genitourinary: Positive for vaginal bleeding and vaginal discharge.  All other systems reviewed and are negative.    Allergies  Azithromycin; Diphenhydramine hcl; Hydrocodone; Ibuprofen; Metoclopramide  hcl; Penicillins; and Tramadol hcl  Home Medications   Current Outpatient Rx  Name Route Sig Dispense Refill  . BUPROPION HCL ER (XL) 300 MG PO TB24 Oral Take 300 mg by mouth daily.      Marland Kitchen LEVONORGEST-ETH ESTRAD 91-DAY 0.15-0.03 MG PO TABS Oral Take 1 tablet by mouth daily.      Marland Kitchen METOPROLOL SUCCINATE ER 25 MG PO TB24 Oral Take 25 mg by mouth daily.      Marland Kitchen NAPROXEN SODIUM 220 MG PO TABS Oral Take 440 mg by mouth 2 (two) times daily as needed. For pain     . OXYCODONE-ACETAMINOPHEN 5-325 MG PO TABS Oral Take 2 tablets by mouth every 4 (four) hours as needed for pain. 10 tablet 0    BP 131/82  Pulse 74  Temp(Src) 98.8 F (37.1 C) (Oral)  Resp 20  Ht 5\' 4"  (1.626 m)  Wt 115 lb (52.164 kg)  BMI 19.74 kg/m2  SpO2 100%  LMP 01/16/2011  Physical Exam  Nursing note and vitals reviewed. Constitutional: She is oriented to person, place, and time. She appears well-developed and well-nourished. No distress.  HENT:  Head: Normocephalic and atraumatic.  Mouth/Throat: Oropharynx is clear and moist.  Eyes: Pupils are equal, round, and reactive to light.  Neck: Normal range of motion. Neck supple.  Cardiovascular: Normal rate, regular rhythm, normal heart sounds and intact distal pulses.   Pulmonary/Chest: Effort normal and breath sounds normal. No respiratory distress.  Abdominal: Soft. Normal appearance and bowel  sounds are normal. She exhibits no distension. There is tenderness. There is no rebound and no guarding.       Moderate tenderness to the RLQ  Genitourinary:       Pelvic exam shows some dark blood in vault, no active bleeding, no discharge.  Mild TTP right adnexa  Musculoskeletal: Normal range of motion.  Neurological: She is alert and oriented to person, place, and time.  Skin: Skin is warm and dry.  Psychiatric: She has a normal mood and affect. Her behavior is normal.    ED Course  Procedures (including critical care time) DIAGNOSTIC STUDIES: Oxygen Saturation is 100% on  room air, normal by my interpretation.    COORDINATION OF CARE:    Results for orders placed during the hospital encounter of 02/19/11  PREGNANCY, URINE      Component Value Range   Preg Test, Ur NEGATIVE    URINALYSIS, ROUTINE W REFLEX MICROSCOPIC      Component Value Range   Color, Urine YELLOW  YELLOW    APPearance CLEAR  CLEAR    Specific Gravity, Urine 1.023  1.005 - 1.030    pH 6.0  5.0 - 8.0    Glucose, UA NEGATIVE  NEGATIVE (mg/dL)   Hgb urine dipstick NEGATIVE  NEGATIVE    Bilirubin Urine NEGATIVE  NEGATIVE    Ketones, ur NEGATIVE  NEGATIVE (mg/dL)   Protein, ur NEGATIVE  NEGATIVE (mg/dL)   Urobilinogen, UA 1.0  0.0 - 1.0 (mg/dL)   Nitrite NEGATIVE  NEGATIVE    Leukocytes, UA NEGATIVE  NEGATIVE   CBC      Component Value Range   WBC 11.0 (*) 4.0 - 10.5 (K/uL)   RBC 5.00  3.87 - 5.11 (MIL/uL)   Hemoglobin 14.8  12.0 - 15.0 (g/dL)   HCT 40.9  81.1 - 91.4 (%)   MCV 87.0  78.0 - 100.0 (fL)   MCH 29.6  26.0 - 34.0 (pg)   MCHC 34.0  30.0 - 36.0 (g/dL)   RDW 78.2  95.6 - 21.3 (%)   Platelets 195  150 - 400 (K/uL)  DIFFERENTIAL      Component Value Range   Neutrophils Relative 78 (*) 43 - 77 (%)   Neutro Abs 8.6 (*) 1.7 - 7.7 (K/uL)   Lymphocytes Relative 17  12 - 46 (%)   Lymphs Abs 1.8  0.7 - 4.0 (K/uL)   Monocytes Relative 5  3 - 12 (%)   Monocytes Absolute 0.6  0.1 - 1.0 (K/uL)   Eosinophils Relative 0  0 - 5 (%)   Eosinophils Absolute 0.0  0.0 - 0.7 (K/uL)   Basophils Relative 0  0 - 1 (%)   Basophils Absolute 0.0  0.0 - 0.1 (K/uL)  BASIC METABOLIC PANEL      Component Value Range   Sodium 139  135 - 145 (mEq/L)   Potassium 3.8  3.5 - 5.1 (mEq/L)   Chloride 105  96 - 112 (mEq/L)   CO2 21  19 - 32 (mEq/L)   Glucose, Bld 98  70 - 99 (mg/dL)   BUN 16  6 - 23 (mg/dL)   Creatinine, Ser 0.86  0.50 - 1.10 (mg/dL)   Calcium 9.9  8.4 - 57.8 (mg/dL)   GFR calc non Af Amer 89 (*) >90 (mL/min)   GFR calc Af Amer >90  >90 (mL/min)   No results found.   No  results found.   1. Pelvic pain in female  MDM  Pt with right pelvic pain, says that she has same pain every month around her period, this time was at unexpected time.  Non-acute abdomen.  Possible ovarian cyst vs endometriosis.  Doubt torsion given that pt has same symptoms every month.  Will refer to gyn.  Give precautions to return for worsening symptoms  I personally performed the services described in this documentation, which was scribed in my presence.  The recorded information has been reviewed and considered.         Rolan Bucco, MD 02/19/11 2218

## 2011-02-19 NOTE — ED Notes (Signed)
Pt. Stated, I'm having abd. Cramping and I started bleeding last night.

## 2011-02-19 NOTE — ED Notes (Signed)
Pt. In General waiting

## 2011-02-19 NOTE — ED Notes (Signed)
C/o abd pain since yesterday- reports "doubled over with pain" earlier today- pt reports brown vaginal discharge with "clots coming out" since yesterday evening- LMP in November- pt on seasonal birth control

## 2011-03-28 ENCOUNTER — Emergency Department (HOSPITAL_COMMUNITY)
Admission: EM | Admit: 2011-03-28 | Discharge: 2011-03-29 | Disposition: A | Discharge status: Home / Self Care | Payer: Self-pay | Attending: Emergency Medicine | Admitting: Emergency Medicine

## 2011-03-28 ENCOUNTER — Encounter (HOSPITAL_COMMUNITY): Payer: Self-pay | Admitting: *Deleted

## 2011-03-28 DIAGNOSIS — M545 Low back pain, unspecified: Secondary | ICD-10-CM | POA: Insufficient documentation

## 2011-03-28 DIAGNOSIS — Z79899 Other long term (current) drug therapy: Secondary | ICD-10-CM | POA: Insufficient documentation

## 2011-03-28 DIAGNOSIS — M25559 Pain in unspecified hip: Secondary | ICD-10-CM | POA: Insufficient documentation

## 2011-03-28 DIAGNOSIS — F341 Dysthymic disorder: Secondary | ICD-10-CM | POA: Insufficient documentation

## 2011-03-28 DIAGNOSIS — M7070 Other bursitis of hip, unspecified hip: Secondary | ICD-10-CM

## 2011-03-28 DIAGNOSIS — K219 Gastro-esophageal reflux disease without esophagitis: Secondary | ICD-10-CM | POA: Insufficient documentation

## 2011-03-28 DIAGNOSIS — G8929 Other chronic pain: Secondary | ICD-10-CM | POA: Insufficient documentation

## 2011-03-28 DIAGNOSIS — IMO0001 Reserved for inherently not codable concepts without codable children: Secondary | ICD-10-CM | POA: Insufficient documentation

## 2011-03-28 DIAGNOSIS — M76899 Other specified enthesopathies of unspecified lower limb, excluding foot: Secondary | ICD-10-CM | POA: Insufficient documentation

## 2011-03-28 NOTE — ED Notes (Signed)
She fell on the ice on Thursday and she has pain in both her hips and she has multiple brusises and a cut on her lip

## 2011-03-29 MED ORDER — MELOXICAM 7.5 MG PO TABS: 7.5000 mg | ORAL_TABLET | Freq: Every day | ORAL | Status: DC

## 2011-03-29 MED ORDER — OXYCODONE-ACETAMINOPHEN 5-325 MG PO TABS: 2.0000 | ORAL_TABLET | ORAL | Status: AC | PRN

## 2011-03-29 NOTE — ED Provider Notes (Signed)
History     CSN: [id number]  Arrival date & time 03/28/11  2145   First MD Initiated Contact with Patient 03/28/11 2319      Chief Complaint  Patient presents with  . Hip Pain    (Consider location/radiation/quality/duration/timing/severity/associated sxs/prior treatment) HPI Comments: Patient here with chronic bilateral hip pain - she states she has been diagnosed with bursitis in the past - states the pain has been so bad that she is unable to sleep - has been taking naproxen without relief.  Patient is a 26 y.o. female presenting with hip pain. The history is provided by the patient. No language interpreter was used.  Hip Pain This is a recurrent problem. The current episode started more than 1 year ago. The problem occurs constantly. The problem has been unchanged. Associated symptoms include arthralgias and myalgias. Pertinent negatives include no abdominal pain, anorexia, chest pain, chills, coughing, fatigue, fever, headaches, joint swelling, neck pain, numbness, rash, sore throat, swollen glands, vomiting or weakness. The symptoms are aggravated by nothing. She has tried nothing for the symptoms. The treatment provided no relief.    Past Medical History  Diagnosis Date  . Tachycardia   . Reflux   . Anxiety   . Depression     Past Surgical History  Procedure Date  . Knee dislocation surgery   . Knee arthroscopy   . Wisdom tooth extraction   . Tonsillectomy   . Adenoidectomy     History reviewed. No pertinent family history.  History  Substance Use Topics  . Smoking status: Never Smoker   . Smokeless tobacco: Never Used  . Alcohol Use: No    OB History    Grav Para Term Preterm Abortions TAB SAB Ect Mult Living                  Review of Systems  Constitutional: Negative for fever, chills and fatigue.  HENT: Negative for sore throat and neck pain.   Respiratory: Negative for cough.   Cardiovascular: Negative for chest pain.  Gastrointestinal: Negative  for vomiting, abdominal pain and anorexia.  Musculoskeletal: Positive for myalgias and arthralgias. Negative for joint swelling.  Skin: Negative for rash.  Neurological: Negative for weakness, numbness and headaches.  All other systems reviewed and are negative.    Allergies  Azithromycin; Diphenhydramine hcl; Hydrocodone; Ibuprofen; Metoclopramide hcl; Penicillins; and Tramadol hcl  Home Medications   Current Outpatient Rx  Name Route Sig Dispense Refill  . BUPROPION HCL ER (XL) 300 MG PO TB24 Oral Take 300 mg by mouth daily.      Marland Kitchen LEVONORGEST-ETH ESTRAD 91-DAY 0.15-0.03 MG PO TABS Oral Take 1 tablet by mouth daily.      Marland Kitchen METOPROLOL SUCCINATE ER 25 MG PO TB24 Oral Take 25 mg by mouth daily.      Marland Kitchen NAPROXEN SODIUM 220 MG PO TABS Oral Take 440 mg by mouth 2 (two) times daily as needed. For pain      BP 127/90  Pulse 121  Temp(Src) 97.8 F (36.6 C) (Oral)  Resp 19  SpO2 100%  LMP 02/25/2011  Physical Exam  Nursing note and vitals reviewed. Constitutional: She is oriented to person, place, and time. She appears well-developed and well-nourished. No distress.  HENT:  Head: Normocephalic and atraumatic.  Right Ear: External ear normal.  Left Ear: External ear normal.  Nose: Nose normal.  Mouth/Throat: Oropharynx is clear and moist. No oropharyngeal exudate.  Eyes: Conjunctivae are normal. Pupils are equal, round, and reactive to  light. No scleral icterus.  Neck: Normal range of motion. Neck supple.  Cardiovascular: Normal rate, regular rhythm and normal heart sounds.  Exam reveals no gallop and no friction rub.   No murmur heard. Pulmonary/Chest: Effort normal and breath sounds normal. She exhibits no tenderness.  Abdominal: Soft. Bowel sounds are normal. She exhibits no distension. There is no tenderness.  Musculoskeletal:       Right hip: She exhibits tenderness. She exhibits no bony tenderness, no swelling and no crepitus.       Left hip: She exhibits tenderness. She  exhibits normal range of motion, normal strength, no bony tenderness and no swelling.       Lumbar back: She exhibits tenderness. She exhibits normal range of motion.  Lymphadenopathy:    She has no cervical adenopathy.  Neurological: She is alert and oriented to person, place, and time. No cranial nerve deficit.  Skin: Skin is warm and dry. No rash noted. No erythema. No pallor.  Psychiatric: She has a normal mood and affect. Her behavior is normal. Judgment and thought content normal.    ED Course  Procedures (including critical care time)  Labs Reviewed - No data to display No results found.   Bilateral hip bursitis    MDM  Acute exacerbation of patient's chronic pain - is able to walk - will follow up with Dr. Eulah Pont, her orthopedist.        Izola Price. [place], [place] 03/29/11 0005

## 2011-03-30 NOTE — ED Provider Notes (Signed)
Medical screening examination/treatment/procedure(s) were performed by non-physician practitioner and as supervising physician I was immediately available for consultation/collaboration.   Forbes Cellar, MD 03/30/11 0030

## 2011-05-23 DIAGNOSIS — Z79899 Other long term (current) drug therapy: Secondary | ICD-10-CM | POA: Insufficient documentation

## 2011-05-23 DIAGNOSIS — K219 Gastro-esophageal reflux disease without esophagitis: Secondary | ICD-10-CM | POA: Insufficient documentation

## 2011-05-23 DIAGNOSIS — M79609 Pain in unspecified limb: Secondary | ICD-10-CM | POA: Insufficient documentation

## 2011-05-23 DIAGNOSIS — G8929 Other chronic pain: Secondary | ICD-10-CM | POA: Insufficient documentation

## 2011-05-23 DIAGNOSIS — F341 Dysthymic disorder: Secondary | ICD-10-CM | POA: Insufficient documentation

## 2011-05-24 ENCOUNTER — Emergency Department (HOSPITAL_COMMUNITY)
Admission: EM | Admit: 2011-05-24 | Discharge: 2011-05-24 | Disposition: A | Discharge status: Home / Self Care | Payer: Self-pay | Attending: Emergency Medicine | Admitting: Emergency Medicine

## 2011-05-24 ENCOUNTER — Encounter (HOSPITAL_COMMUNITY): Payer: Self-pay | Admitting: Emergency Medicine

## 2011-05-24 DIAGNOSIS — G8929 Other chronic pain: Secondary | ICD-10-CM

## 2011-05-24 MED ORDER — PREGABALIN 25 MG PO CAPS: 25.0000 mg | ORAL_CAPSULE | Freq: Two times a day (BID) | ORAL | Status: DC

## 2011-05-24 MED ORDER — HYDROMORPHONE HCL PF 1 MG/ML IJ SOLN
1.0000 mg | Freq: Once | INTRAMUSCULAR | Status: AC
  Administered 2011-05-24: 1 mg via INTRAMUSCULAR
  Filled 2011-05-24: qty 1

## 2011-05-24 MED ORDER — CYCLOBENZAPRINE HCL 10 MG PO TABS
5.0000 mg | ORAL_TABLET | Freq: Once | ORAL | Status: DC
  Filled 2011-05-24: qty 1

## 2011-05-24 MED ORDER — ACETAMINOPHEN 325 MG PO TABS
650.0000 mg | ORAL_TABLET | Freq: Once | ORAL | Status: AC
  Administered 2011-05-24: 650 mg via ORAL
  Filled 2011-05-24: qty 2

## 2011-05-24 NOTE — ED Notes (Signed)
Dr. Dierdre Highman at bedside at this time

## 2011-05-24 NOTE — ED Notes (Signed)
Pt states she is having pain in both her legs and both her hips  Pt states the right hurts more than the left  Pt states they have been bothering her for a while but have gotten worse over the past few days  Pt denies any injury

## 2011-05-24 NOTE — Discharge Instructions (Signed)

## 2011-05-25 NOTE — ED Provider Notes (Signed)
History     CSN: [id number]  Arrival date & time 05/23/11  2355   First MD Initiated Contact with Patient 05/24/11 0256      Chief Complaint  Patient presents with  . Leg Pain    (Consider location/radiation/quality/duration/timing/severity/associated sxs/prior treatment) The history is provided by the patient and a parent.   patient has long-standing history of chronic pain. She was previously followed by the pain clinic but lost her Medicaid and now is off of her pain medications. She is having a flare of her chronic leg pain that starts in bilateral thighs and radiates down both her legs. She denies any back pain at this time. No weakness or numbness. No fevers or chills. No incontinence. No paresthesias. Patient is requesting lyrica. She states that she was scheduled to have an appointment with her primary care doctor tomorrow but had to cancel due to death of a close family member.  She has rescheduled her appointment for next week. She is requesting pain medications now and prescriptions. No new symptoms. Pain is sharp in quality. Pain aggravated by any kind of movement or ambulation. Minimal relief with rest.  Past Medical History  Diagnosis Date  . Tachycardia   . Reflux   . Anxiety   . Depression     Past Surgical History  Procedure Date  . Knee dislocation surgery   . Knee arthroscopy   . Wisdom tooth extraction   . Tonsillectomy   . Adenoidectomy     Family History  Problem Relation Age of Onset  . Diabetes Other   . Hypertension Other     History  Substance Use Topics  . Smoking status: Never Smoker   . Smokeless tobacco: Never Used  . Alcohol Use: No    OB History    Grav Para Term Preterm Abortions TAB SAB Ect Mult Living                  Review of Systems  Constitutional: Negative for fever and chills.  HENT: Negative for neck pain and neck stiffness.   Eyes: Negative for pain.  Respiratory: Negative for shortness of breath.   Cardiovascular:  Negative for chest pain.  Gastrointestinal: Negative for abdominal pain.  Genitourinary: Negative for dysuria.  Musculoskeletal: Positive for arthralgias. Negative for joint swelling and gait problem.  Skin: Negative for rash.  Neurological: Negative for headaches.  All other systems reviewed and are negative.    Allergies  Azithromycin; Diphenhydramine hcl; Hydrocodone; Ibuprofen; Metoclopramide hcl; Penicillins; and Tramadol hcl  Home Medications   Current Outpatient Rx  Name Route Sig Dispense Refill  . BUPROPION HCL ER (XL) 300 MG PO TB24 Oral Take 300 mg by mouth daily.      Marland Kitchen LEVONORGEST-ETH ESTRAD 91-DAY 0.15-0.03 MG PO TABS Oral Take 1 tablet by mouth daily.      Marland Kitchen METOPROLOL SUCCINATE ER 25 MG PO TB24 Oral Take 25 mg by mouth daily.      Marland Kitchen NAPROXEN SODIUM 220 MG PO TABS Oral Take 440 mg by mouth 2 (two) times daily as needed. For pain    . MELOXICAM 7.5 MG PO TABS Oral Take 1 tablet (7.5 mg total) by mouth daily. 30 tablet 0  . PREGABALIN 25 MG PO CAPS Oral Take 1 capsule (25 mg total) by mouth 2 (two) times daily. 14 capsule 0    BP 136/99  Pulse 96  Temp(Src) 98.2 F (36.8 C) (Oral)  Resp 22  SpO2 96%  Physical Exam  Constitutional: She is oriented to person, place, and time. She appears well-developed and well-nourished.  HENT:  Head: Normocephalic and atraumatic.  Eyes: Conjunctivae and EOM are normal. Pupils are equal, round, and reactive to light.  Neck: Trachea normal. Neck supple. No thyromegaly present.  Cardiovascular: Normal rate, regular rhythm, S1 normal, S2 normal and normal pulses.     No systolic murmur is present   No diastolic murmur is present  Pulses:      Radial pulses are 2+ on the right side, and 2+ on the left side.  Pulmonary/Chest: Effort normal and breath sounds normal. She has no wheezes. She has no rhonchi. She has no rales. She exhibits no tenderness.  Abdominal: Soft. Normal appearance and bowel sounds are normal. There is no  tenderness. There is no CVA tenderness and negative Murphy's sign.  Musculoskeletal:       Localizes discomfort to bilateral hips with mild tenderness to palpation. No deformities. No rash. No skin changes. Distal neurovascular bilaterally intact. Good range of motion in hips.  BLE:s Calves nontender, no cords or erythema, negative Homans sign  Neurological: She is alert and oriented to person, place, and time. She has normal strength. No cranial nerve deficit or sensory deficit. GCS eye subscore is 4. GCS verbal subscore is 5. GCS motor subscore is 6.  Skin: Skin is warm and dry. No rash noted. She is not diaphoretic.  Psychiatric: Her speech is normal.       Cooperative and appropriate    ED Course  Procedures (including critical care time)   Pain medications provided.  1. Chronic pain       MDM  Acute exacerbation of chronic pains. Prescription provided. Patient and her mother both state understanding all discharge and followup instructions and agreed to strict return precautions. No indication for imaging or workup at this time        Sunnie Nielsen, MD 05/25/11 [phone]

## 2011-06-17 ENCOUNTER — Emergency Department (HOSPITAL_COMMUNITY)
Admission: EM | Admit: 2011-06-17 | Discharge: 2011-06-17 | Disposition: A | Discharge status: Home / Self Care | Payer: Self-pay | Attending: Emergency Medicine | Admitting: Emergency Medicine

## 2011-06-17 ENCOUNTER — Encounter (HOSPITAL_COMMUNITY): Payer: Self-pay | Admitting: Emergency Medicine

## 2011-06-17 DIAGNOSIS — M719 Bursopathy, unspecified: Secondary | ICD-10-CM

## 2011-06-17 DIAGNOSIS — M25559 Pain in unspecified hip: Secondary | ICD-10-CM | POA: Insufficient documentation

## 2011-06-17 DIAGNOSIS — M7150 Other bursitis, not elsewhere classified, unspecified site: Secondary | ICD-10-CM | POA: Insufficient documentation

## 2011-06-17 MED ORDER — MELOXICAM 15 MG PO TABS: 15.0000 mg | ORAL_TABLET | Freq: Every day | ORAL | Status: DC

## 2011-06-17 MED ORDER — HYDROMORPHONE HCL PF 2 MG/ML IJ SOLN
2.0000 mg | Freq: Once | INTRAMUSCULAR | Status: AC
  Administered 2011-06-17: 2 mg via INTRAMUSCULAR
  Filled 2011-06-17: qty 1

## 2011-06-17 MED ORDER — PREGABALIN 25 MG PO CAPS: 50.0000 mg | ORAL_CAPSULE | Freq: Three times a day (TID) | ORAL | Status: DC

## 2011-06-17 NOTE — ED Notes (Signed)
C/o bilateral hip pain that radiates down both legs.  Pt states she has had pain for 1 year but it is worse today.

## 2011-06-17 NOTE — Discharge Instructions (Signed)
Adriana Galvan we treated your pain in the ER tonight with IM Dilaudid. Get the Lyrica filled and start taking that. You can also take the mobic with the lyrica.  Return to the ER for uncontrolled nausea and vomiting bowel or bladder problems or other concerns.  Bursitis Bursitis is when the fluid-filled sac (bursa) that covers and protects a joint gets puffy and irritated. The elbow, shoulder, hip, and knee joints are most often affected. HOME CARE  Put ice on the area.   Put ice in a plastic bag.   Place a towel between your skin and the bag.   Leave the ice on for 15 to 20 minutes, 3 to 4 times a day.   Put the joint through a full range of motion 4 times a day. Rest the injured joint at other times. When you have less pain, begin slow movements and usual activities.   Only take medicine as told by your doctor.   Follow up with your doctor. Any delay in care could stop the bursitis from healing. This could cause long-term pain.  GET HELP RIGHT AWAY IF:   You have more pain with treatment.   You have a temperature by mouth above 102 F (38.9 C), not controlled by medicine.   You have heat and irritation over the fluid-filled sac.  MAKE SURE YOU:   Understand these instructions.   Will watch your condition.   Will get help right away if you are not doing well or get worse.  Document Released: 08/04/2009 Document Revised: 02/03/2011 Document Reviewed: 08/04/2009 [facility] Patient Information 2012 [place], [place].

## 2011-06-18 NOTE — ED Provider Notes (Signed)
History     CSN: [id number]  Arrival date & time 06/17/11  1846   First MD Initiated Contact with Patient 06/17/11 1956      Chief Complaint  Patient presents with  . Hip Pain    (Consider location/radiation/quality/duration/timing/severity/associated sxs/prior treatment) Patient is a 26 y.o. female presenting with hip pain. The history is provided by the patient and the spouse. No language interpreter was used.  Hip Pain This is a chronic problem. The current episode started more than 1 year ago. The problem occurs daily. The problem has been gradually worsening. Pertinent negatives include no chills, diaphoresis, fever, joint swelling, nausea, numbness, vomiting or weakness. The symptoms are aggravated by walking. She has tried NSAIDs for the symptoms. The treatment provided mild relief.   States she is here with her usual hip pain from her bursitis. Past Medical History  Diagnosis Date  . Tachycardia   . Reflux   . Anxiety   . Depression     Past Surgical History  Procedure Date  . Knee dislocation surgery   . Knee arthroscopy   . Wisdom tooth extraction   . Tonsillectomy   . Adenoidectomy     Family History  Problem Relation Age of Onset  . Diabetes Other   . Hypertension Other     History  Substance Use Topics  . Smoking status: Never Smoker   . Smokeless tobacco: Never Used  . Alcohol Use: No    OB History    Grav Para Term Preterm Abortions TAB SAB Ect Mult Living                  Review of Systems  Constitutional: Negative.  Negative for fever, chills and diaphoresis.  HENT: Negative.   Eyes: Negative.   Respiratory: Negative.   Cardiovascular: Negative.  Negative for leg swelling.  Gastrointestinal: Negative.  Negative for nausea and vomiting.  Musculoskeletal: Negative for back pain, joint swelling and gait problem.  Skin: Negative.   Neurological: Negative.  Negative for weakness and numbness.  Psychiatric/Behavioral: Negative.   All other  systems reviewed and are negative.    Allergies  Azithromycin; Diphenhydramine hcl; Hydrocodone; Ibuprofen; Metoclopramide hcl; Penicillins; and Tramadol hcl  Home Medications   Current Outpatient Rx  Name Route Sig Dispense Refill  . BUPROPION HCL ER (XL) 300 MG PO TB24 Oral Take 300 mg by mouth daily.      Marland Kitchen LEVONORGEST-ETH ESTRAD 91-DAY 0.15-0.03 MG PO TABS Oral Take 1 tablet by mouth daily.      Marland Kitchen METOPROLOL SUCCINATE ER 25 MG PO TB24 Oral Take 25 mg by mouth daily.      Marland Kitchen NAPROXEN SODIUM 220 MG PO TABS Oral Take 440 mg by mouth 2 (two) times daily as needed. For pain    . MELOXICAM 15 MG PO TABS Oral Take 1 tablet (15 mg total) by mouth daily. 60 tablet 0  . PREGABALIN 25 MG PO CAPS Oral Take 2 capsules (50 mg total) by mouth 3 (three) times daily. 30 capsule 0    BP 125/82  Pulse 93  Temp(Src) 98.2 F (36.8 C) (Oral)  Resp 16  SpO2 100%  Physical Exam  Nursing note and vitals reviewed. Constitutional: She is oriented to person, place, and time. She appears well-developed and well-nourished.  HENT:  Head: Normocephalic and atraumatic.  Eyes: Conjunctivae and EOM are normal. Pupils are equal, round, and reactive to light.  Neck: Normal range of motion. Neck supple.  Cardiovascular: Normal rate.  Pulmonary/Chest: Effort normal.  Abdominal: Soft.  Musculoskeletal: She exhibits tenderness. She exhibits no edema.       bilat hips  Neurological: She is alert and oriented to person, place, and time. She has normal reflexes.  Skin: Skin is warm and dry.  Psychiatric: She has a normal mood and affect.    ED Course  Procedures (including critical care time)  Labs Reviewed - No data to display No results found.   1. Bursitis       MDM  Chronic bilateral hip pain.  Out of lyrica.  Rx for lyrica and mobic.  Dilaudid 2mg  im in ER. Keep appointment with Jovita Kussmaul.  Return if worse.         Remi Haggard, NP 06/18/11 1016

## 2011-06-18 NOTE — ED Provider Notes (Signed)
Medical screening examination/treatment/procedure(s) were performed by non-physician practitioner and as supervising physician I was immediately available for consultation/collaboration.   Laray Anger, DO 06/18/11 1637

## 2011-06-22 ENCOUNTER — Encounter (HOSPITAL_COMMUNITY): Payer: Self-pay | Admitting: Family Medicine

## 2011-06-22 ENCOUNTER — Emergency Department (HOSPITAL_COMMUNITY)
Admission: EM | Admit: 2011-06-22 | Discharge: 2011-06-23 | Disposition: A | Discharge status: Home / Self Care | Payer: Self-pay | Attending: Emergency Medicine | Admitting: Emergency Medicine

## 2011-06-22 DIAGNOSIS — M25559 Pain in unspecified hip: Secondary | ICD-10-CM | POA: Insufficient documentation

## 2011-06-22 DIAGNOSIS — IMO0002 Reserved for concepts with insufficient information to code with codable children: Secondary | ICD-10-CM | POA: Insufficient documentation

## 2011-06-22 DIAGNOSIS — M549 Dorsalgia, unspecified: Secondary | ICD-10-CM

## 2011-06-22 DIAGNOSIS — M5410 Radiculopathy, site unspecified: Secondary | ICD-10-CM

## 2011-06-22 DIAGNOSIS — M545 Low back pain, unspecified: Secondary | ICD-10-CM | POA: Insufficient documentation

## 2011-06-22 DIAGNOSIS — K219 Gastro-esophageal reflux disease without esophagitis: Secondary | ICD-10-CM | POA: Insufficient documentation

## 2011-06-22 DIAGNOSIS — R269 Unspecified abnormalities of gait and mobility: Secondary | ICD-10-CM | POA: Insufficient documentation

## 2011-06-22 DIAGNOSIS — Z79899 Other long term (current) drug therapy: Secondary | ICD-10-CM | POA: Insufficient documentation

## 2011-06-22 DIAGNOSIS — F341 Dysthymic disorder: Secondary | ICD-10-CM | POA: Insufficient documentation

## 2011-06-22 NOTE — ED Notes (Signed)
Patient states that she was seen [facility] on Friday for bilateral hip and leg pain. Was prescribed Mobic and Lyrica without relief. Was suppose to return to [facility] for follow up but decided to come here. Had had Tylenol today for pain.

## 2011-06-23 ENCOUNTER — Emergency Department (HOSPITAL_COMMUNITY): Payer: Self-pay

## 2011-06-23 MED ORDER — DIAZEPAM 5 MG/ML IJ SOLN
5.0000 mg | Freq: Once | INTRAMUSCULAR | Status: AC
  Administered 2011-06-23: 5 mg via INTRAVENOUS
  Filled 2011-06-23: qty 2

## 2011-06-23 MED ORDER — METHYLPREDNISOLONE SODIUM SUCC 125 MG IJ SOLR
125.0000 mg | Freq: Once | INTRAMUSCULAR | Status: AC
  Administered 2011-06-23: 125 mg via INTRAVENOUS
  Filled 2011-06-23: qty 2

## 2011-06-23 MED ORDER — OXYCODONE-ACETAMINOPHEN 5-325 MG PO TABS: 1.0000 | ORAL_TABLET | ORAL | Status: AC | PRN

## 2011-06-23 MED ORDER — DIAZEPAM 5 MG PO TABS: 5.0000 mg | ORAL_TABLET | Freq: Four times a day (QID) | ORAL | Status: AC | PRN

## 2011-06-23 MED ORDER — PREDNISONE 10 MG PO TABS: ORAL_TABLET | ORAL | Status: DC

## 2011-06-23 MED ORDER — HYDROMORPHONE HCL PF 1 MG/ML IJ SOLN
1.0000 mg | Freq: Once | INTRAMUSCULAR | Status: AC
  Administered 2011-06-23: 1 mg via INTRAVENOUS
  Filled 2011-06-23: qty 1

## 2011-06-23 NOTE — Discharge Instructions (Signed)
Please followup with your primary care provider as planned. Return to emergency room for any worsening or concerning new symptoms.   Back Pain, Adult Low back pain is very common. About 1 in 5 people have back pain.The cause of low back pain is rarely dangerous. The pain often gets better over time.About half of people with a sudden onset of back pain feel better in just 2 weeks. About 8 in 10 people feel better by 6 weeks.  CAUSES Some common causes of back pain include:  Strain of the muscles or ligaments supporting the spine.   Wear and tear (degeneration) of the spinal discs.   Arthritis.   Direct injury to the back.  DIAGNOSIS Most of the time, the direct cause of low back pain is not known.However, back pain can be treated effectively even when the exact cause of the pain is unknown.Answering your caregiver's questions about your overall health and symptoms is one of the most accurate ways to make sure the cause of your pain is not dangerous. If your caregiver needs more information, he or she may order lab work or imaging tests (X-rays or MRIs).However, even if imaging tests show changes in your back, this usually does not require surgery. HOME CARE INSTRUCTIONS For many people, back pain returns.Since low back pain is rarely dangerous, it is often a condition that people can learn to [facility] their own.   Remain active. It is stressful on the back to sit or stand in one place. Do not sit, drive, or stand in one place for more than 30 minutes at a time. Take short walks on level surfaces as soon as pain allows.Try to increase the length of time you walk each day.   Do not stay in bed.Resting more than 1 or 2 days can delay your recovery.   Do not avoid exercise or work.Your body is made to move.It is not dangerous to be active, even though your back may hurt.Your back will likely heal faster if you return to being active before your pain is gone.   Pay attention to your  body when you bend and lift. Many people have less discomfortwhen lifting if they bend their knees, keep the load close to their bodies,and avoid twisting. Often, the most comfortable positions are those that put less stress on your recovering back.   Find a comfortable position to sleep. Use a firm mattress and lie on your side with your knees slightly bent. If you lie on your back, put a pillow under your knees.   Only take over-the-counter or prescription medicines as directed by your caregiver. Over-the-counter medicines to reduce pain and inflammation are often the most helpful.Your caregiver may prescribe muscle relaxant drugs.These medicines help dull your pain so you can more quickly return to your normal activities and healthy exercise.   Put ice on the injured area.   Put ice in a plastic bag.   Place a towel between your skin and the bag.   Leave the ice on for 15 to 20 minutes, 3 to 4 times a day for the first 2 to 3 days. After that, ice and heat may be alternated to reduce pain and spasms.   Ask your caregiver about trying back exercises and gentle massage. This may be of some benefit.   Avoid feeling anxious or stressed.Stress increases muscle tension and can worsen back pain.It is important to recognize when you are anxious or stressed and learn ways to manage it.Exercise is a great  option.  SEEK MEDICAL CARE IF:  You have pain that is not relieved with rest or medicine.   You have pain that does not improve in 1 week.   You have new symptoms.   You are generally not feeling well.  SEEK IMMEDIATE MEDICAL CARE IF:   You have pain that radiates from your back into your legs.   You develop new bowel or bladder control problems.   You have unusual weakness or numbness in your arms or legs.   You develop nausea or vomiting.   You develop abdominal pain.   You feel faint.  Document Released: 02/14/2005 Document Revised: 02/03/2011 Document Reviewed:  07/05/2010 [facility] Patient Information 2012 [place], [place].     Back Exercises Back exercises help treat and prevent back injuries. The goal of back exercises is to increase the strength of your abdominal and back muscles and the flexibility of your back. These exercises should be started when you no longer have back pain. Back exercises include:  Pelvic Tilt. Lie on your back with your knees bent. Tilt your pelvis until the lower part of your back is against the floor. Hold this position 5 to 10 sec and repeat 5 to 10 times.   Knee to Chest. Pull first 1 knee up against your chest and hold for 20 to 30 seconds, repeat this with the other knee, and then both knees. This may be done with the other leg straight or bent, whichever feels better.   Sit-Ups or Curl-Ups. Bend your knees 90 degrees. Start with tilting your pelvis, and do a partial, slow sit-up, lifting your trunk only 30 to 45 degrees off the floor. Take at least 2 to 3 seconds for each sit-up. Do not do sit-ups with your knees out straight. If partial sit-ups are difficult, simply do the above but with only tightening your abdominal muscles and holding it as directed.   Hip-Lift. Lie on your back with your knees flexed 90 degrees. Push down with your feet and shoulders as you raise your hips a couple inches off the floor; hold for 10 seconds, repeat 5 to 10 times.   Back arches. Lie on your stomach, propping yourself up on bent elbows. Slowly press on your hands, causing an arch in your low back. Repeat 3 to 5 times. Any initial stiffness and discomfort should lessen with repetition over time.   Shoulder-Lifts. Lie face down with arms beside your body. Keep hips and torso pressed to floor as you slowly lift your head and shoulders off the floor.  Do not overdo your exercises, especially in the beginning. Exercises may cause you some mild back discomfort which lasts for a few minutes; however, if the pain is more severe, or lasts for  more than 15 minutes, do not continue exercises until you see your caregiver. Improvement with exercise therapy for back problems is slow.  See your caregivers for assistance with developing a proper back exercise program. Document Released: 03/24/2004 Document Revised: 02/03/2011 Document Reviewed: 02/14/2005 [facility] Patient Information 2012 [place], [place].   RESOURCE GUIDE  Dental Problems  Patients with Medicaid: [facility] [phone] [address]  [address] Phone:  [phone]                                                  Phone:  [phone]  If unable to pay or uninsured, contact:  [facility] or [facility]. to become qualified for the adult dental clinic.  Chronic Pain Problems Contact [facility]  [phone] Patients need to be referred by their primary care doctor.  Insufficient Money for Medicine Contact [employer] Way:  call "211" or [facility] (909)661-9853.  No Primary Care Doctor Call [facility]  713-580-3324 Other agencies that provide inexpensive medical care    [facility]  [phone]    [facility]  [phone]    [facility]  [phone]    [facility]  [phone]    [facility]  971-383-5862    [facility]  [phone]  Psychological Services [facility]  [phone] [facility]  [phone] [facility]   [phone] (emergency services [phone])  Substance Abuse Resources [facility]  574-762-2941 [facility] 562-404-9070 [facility] [phone]) [phone] Floydene Flock ([phone] Residential & Outpatient Substance Abuse Program  651-389-2154  Abuse/Neglect [facility] Child Abuse Hotline (671) 190-4115 [facility] Child Abuse Hotline (331)581-2253 (After Hours)  Emergency Shelter [facility]  Ministries 619-457-9438  Maternity Homes Room at the [place] [facility] [place] [phone] [place] Alert Services (424)[phone]  MRSA Hotline #:   775-577-3903    [facility] Resources  [facility]     [employer]                          [facility] Dept. [address] [place]                       [address]      [address]  Blondell Reveal Phone:  [phone]                                   Phone:  [phone]                 Phone:  [phone]  [facility] Phone:  [phone]  [facility] Child Abuse Hotline (603)565-5801 [phone]) [phone] (After Hours)

## 2011-06-23 NOTE — ED Provider Notes (Signed)
History     CSN: [id number]  Arrival date & time 06/22/11  2132   First MD Initiated Contact with Patient 06/23/11 0046      Chief Complaint  Patient presents with  . Hip Pain    HPI  History provided by the patient and mother. Patient is a 26 year old female with history of anxiety and depression, degenerative joint disease and bulging lumbar disc who presents with complaints of bilateral hip pains. Patient reports severe hip pain made worse with movements and with pressure over the hips. Patient reports having similar symptoms for the past month. She was seen and evaluated the emergency room for these complaints. She was told she may have possible bursitis and given prescriptions for pain medications. Patient reports no improvement of symptoms since that time. She has had decreased mobility secondary to pain. Patient also reports having some pain in the low back. She denies any new injury or traumas. Patient denies any urinary or fecal incontinence, urinary retention or perineal numbness. She denies weakness in legs. Patient denies any flank pain, dysuria, urinary previous ear hematuria.    Past Medical History  Diagnosis Date  . Tachycardia   . Reflux   . Anxiety   . Depression     Past Surgical History  Procedure Date  . Knee dislocation surgery   . Knee arthroscopy   . Wisdom tooth extraction   . Tonsillectomy   . Adenoidectomy     Family History  Problem Relation Age of Onset  . Diabetes Other   . Hypertension Other     History  Substance Use Topics  . Smoking status: Never Smoker   . Smokeless tobacco: Never Used  . Alcohol Use: No    OB History    Grav Para Term Preterm Abortions TAB SAB Ect Mult Living                  Review of Systems  Constitutional: Negative for fever.  Gastrointestinal: Negative for nausea, vomiting and abdominal pain.  Genitourinary: Negative for dysuria, frequency, hematuria, flank pain, vaginal bleeding and vaginal discharge.   Musculoskeletal: Positive for back pain and gait problem. Negative for joint swelling.  Skin: Negative for rash.    Allergies  Azithromycin; Diphenhydramine hcl; Hydrocodone; Ibuprofen; Metoclopramide hcl; Penicillins; and Tramadol hcl  Home Medications   Current Outpatient Rx  Name Route Sig Dispense Refill  . BUPROPION HCL ER (XL) 300 MG PO TB24 Oral Take 300 mg by mouth daily.      Marland Kitchen LEVONORGEST-ETH ESTRAD 91-DAY 0.15-0.03 MG PO TABS Oral Take 1 tablet by mouth daily.      . MELOXICAM 15 MG PO TABS Oral Take 1 tablet (15 mg total) by mouth daily. 60 tablet 0  . METOPROLOL SUCCINATE ER 25 MG PO TB24 Oral Take 25 mg by mouth daily.      Marland Kitchen NAPROXEN SODIUM 220 MG PO TABS Oral Take 440 mg by mouth 2 (two) times daily as needed. For pain    . PREGABALIN 25 MG PO CAPS Oral Take 2 capsules (50 mg total) by mouth 3 (three) times daily. 30 capsule 0    BP 133/78  Pulse 119  Temp(Src) 98.2 F (36.8 C) (Oral)  Resp 20  Wt 114 lb 4 oz (51.823 kg)  SpO2 100%  LMP 03/02/2011  Physical Exam  Nursing note and vitals reviewed. Constitutional: She is oriented to person, place, and time. She appears well-developed and well-nourished. No distress.  HENT:  Head: Normocephalic and  atraumatic.  Neck: Normal range of motion. Neck supple.  Cardiovascular: Normal rate and regular rhythm.   Pulmonary/Chest: Effort normal and breath sounds normal. No respiratory distress. She has no wheezes. She has no rales.  Abdominal: Soft. She exhibits no distension. There is no tenderness.       No CVA tenderness.  Musculoskeletal:       Cervical back: Normal.       Thoracic back: Normal.       Lumbar back: She exhibits tenderness and bony tenderness.       Back:       Patient with pain in bilateral hips. No appreciable deformity. No crepitus. Normal range of motion. Normal distal sensations, dorsal pedal pulses bilateral feet. No swelling of lower extremity.  Neurological: She is alert and oriented to  person, place, and time.  Skin: Skin is warm and dry. No rash noted.  Psychiatric: She has a normal mood and affect. Her behavior is normal.    ED Course  Procedures     Ct Pelvis Wo Contrast  06/23/2011  *RADIOLOGY REPORT*  Clinical Data:  Bilateral hip pain for days.  The patient states bulging disc and L5-S1 seen on MRI done in [place].  CT PELVIS WITHOUT CONTRAST  Technique:  Multidetector CT imaging of the pelvis was performed following the standard protocol without intravenous contrast.  Comparison:  Pelvis 10/17/2010.  CT abdomen and pelvis 12/21/2009  Findings:  The bony pelvis and hips appear intact.  No evidence of acute fracture or subluxation.  No focal bone destruction.  Sacrum and SI joints appear intact.  There are benign-appearing areas of sclerosis in the acetabulum bilaterally.  These likely represent benign bone islands.  No soft tissue hematomas.  Visualized pelvic organs appear intact.  No significant pelvic mass or lymphadenopathy appreciated.  The  IMPRESSION: No acute bony abnormalities demonstrated in the pelvis or hips. Benign-appearing areas of sclerosis in the acetabular regions bilaterally.  Stable appearance since prior CT scan.  Original Report Authenticated By: Marlon Pel, M.D.     1. Back pain   2. Radiculopathy       MDM  Patient seen and evaluated. Patient no acute distress but uncomfortable appearing. Patient is tearful.  Will treat pain symptoms. Patient reports prior history of MRI showing bulging disc at L5-S1. Will obtain CT at this time for evaluation of L. spine hips. Patient does not have a red flags for pain.  Patient feeling much better after medications and muscle relaxer. CT unremarkable. Patient instructed to followup with primary care provider or specialist.     Angus Seller, PA 06/23/11 [phone]

## 2011-06-23 NOTE — ED Provider Notes (Signed)
Medical screening examination/treatment/procedure(s) were performed by non-physician practitioner and as supervising physician I was immediately available for consultation/collaboration.   Forbes Cellar, MD 06/23/11 [phone]

## 2011-09-24 ENCOUNTER — Encounter (HOSPITAL_COMMUNITY): Payer: Self-pay | Admitting: *Deleted

## 2011-09-24 ENCOUNTER — Emergency Department (HOSPITAL_COMMUNITY): Payer: No Typology Code available for payment source

## 2011-09-24 ENCOUNTER — Emergency Department (HOSPITAL_COMMUNITY)
Admission: EM | Admit: 2011-09-24 | Discharge: 2011-09-24 | Disposition: A | Discharge status: Home / Self Care | Payer: No Typology Code available for payment source | Attending: Emergency Medicine | Admitting: Emergency Medicine

## 2011-09-24 DIAGNOSIS — Y998 Other external cause status: Secondary | ICD-10-CM | POA: Insufficient documentation

## 2011-09-24 DIAGNOSIS — S39012A Strain of muscle, fascia and tendon of lower back, initial encounter: Secondary | ICD-10-CM

## 2011-09-24 DIAGNOSIS — G8929 Other chronic pain: Secondary | ICD-10-CM | POA: Insufficient documentation

## 2011-09-24 DIAGNOSIS — M7072 Other bursitis of hip, left hip: Secondary | ICD-10-CM | POA: Insufficient documentation

## 2011-09-24 DIAGNOSIS — M7071 Other bursitis of hip, right hip: Secondary | ICD-10-CM | POA: Insufficient documentation

## 2011-09-24 DIAGNOSIS — S161XXA Strain of muscle, fascia and tendon at neck level, initial encounter: Secondary | ICD-10-CM

## 2011-09-24 DIAGNOSIS — T07XXXA Unspecified multiple injuries, initial encounter: Secondary | ICD-10-CM | POA: Insufficient documentation

## 2011-09-24 DIAGNOSIS — S139XXA Sprain of joints and ligaments of unspecified parts of neck, initial encounter: Secondary | ICD-10-CM | POA: Insufficient documentation

## 2011-09-24 DIAGNOSIS — IMO0002 Reserved for concepts with insufficient information to code with codable children: Secondary | ICD-10-CM | POA: Insufficient documentation

## 2011-09-24 DIAGNOSIS — K219 Gastro-esophageal reflux disease without esophagitis: Secondary | ICD-10-CM | POA: Insufficient documentation

## 2011-09-24 DIAGNOSIS — Y93I9 Activity, other involving external motion: Secondary | ICD-10-CM | POA: Insufficient documentation

## 2011-09-24 DIAGNOSIS — S335XXA Sprain of ligaments of lumbar spine, initial encounter: Secondary | ICD-10-CM | POA: Insufficient documentation

## 2011-09-24 DIAGNOSIS — T148XXA Other injury of unspecified body region, initial encounter: Secondary | ICD-10-CM

## 2011-09-24 HISTORY — DX: Other bursitis of hip, right hip: M70.71

## 2011-09-24 HISTORY — DX: Other chronic pain: G89.29

## 2011-09-24 HISTORY — DX: Dorsalgia, unspecified: M54.9

## 2011-09-24 HISTORY — DX: Reserved for concepts with insufficient information to code with codable children: IMO0002

## 2011-09-24 HISTORY — DX: Other bursitis of hip, left hip: M70.72

## 2011-09-24 MED ORDER — METHOCARBAMOL 500 MG PO TABS: 1000.0000 mg | ORAL_TABLET | Freq: Four times a day (QID) | ORAL | Status: AC

## 2011-09-24 MED ORDER — OXYCODONE-ACETAMINOPHEN 5-325 MG PO TABS
2.0000 | ORAL_TABLET | Freq: Once | ORAL | Status: AC
  Administered 2011-09-24: 2 via ORAL
  Filled 2011-09-24: qty 2

## 2011-09-24 MED ORDER — OXYCODONE-ACETAMINOPHEN 5-325 MG PO TABS: 1.0000 | ORAL_TABLET | Freq: Four times a day (QID) | ORAL | Status: AC | PRN

## 2011-09-24 NOTE — ED Notes (Signed)
Patient transported to X-ray 

## 2011-09-24 NOTE — ED Notes (Signed)
Pt logrolled, LSB removed. Pt states has low back pain normally but now pain is worse. C-collar remains in place
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
## 2011-09-24 NOTE — ED Provider Notes (Signed)
Medical screening examination/treatment/procedure(s) were performed by non-physician practitioner and as supervising physician I was immediately available for consultation/collaboration.  Deya Bigos L Aleane Wesenberg, MD 09/24/11 1645 
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
## 2011-10-06 IMAGING — CT CT ABD-PELV W/ CM
2 of 3 series · 16 of 46 positions shown, 18 images · IV contrast (APPLIED)
Comparison: 03/25/2006

CLINICAL DATA: Mid abdominal pain with nausea

CT ABDOMEN AND PELVIS WITH CONTRAST
TECHNIQUE: Multidetector CT imaging of the abdomen and pelvis was
performed following the standard protocol during bolus
administration of intravenous contrast.
Contrast: 100 ml [id number]

[Series 2: abd/pelvis 5.0 b31f · axial · 0.56mm/px · z∈[+800,+1150]mm · 13 of 82 slices shown, 15 images]
[im 6/82  soft-tissue]
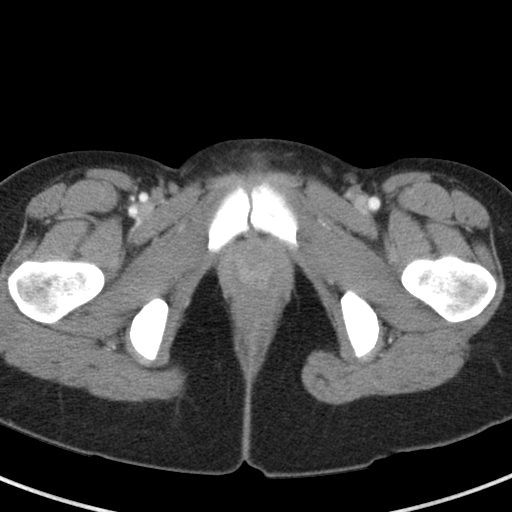
[im 6/82  bone]
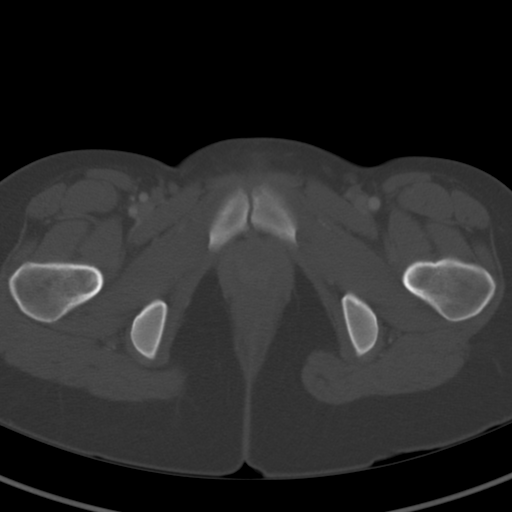
[im 11/82  soft-tissue]
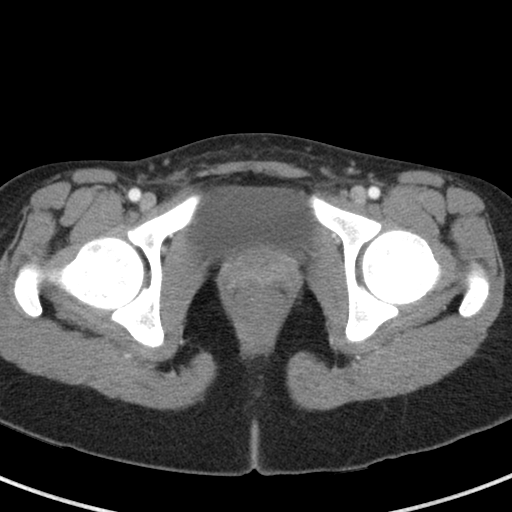
[im 16/82  soft-tissue]
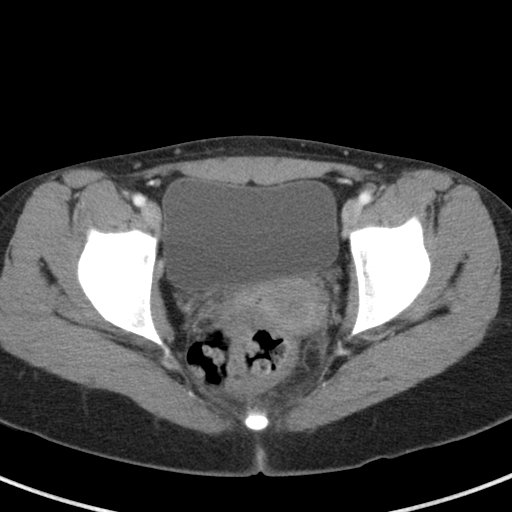
[im 24/82  soft-tissue]
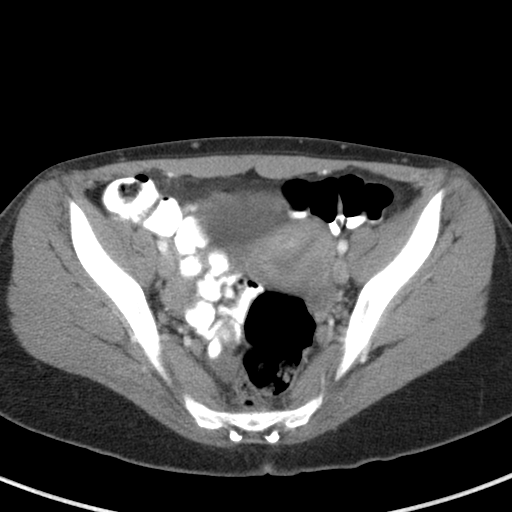
[im 29/82  soft-tissue]
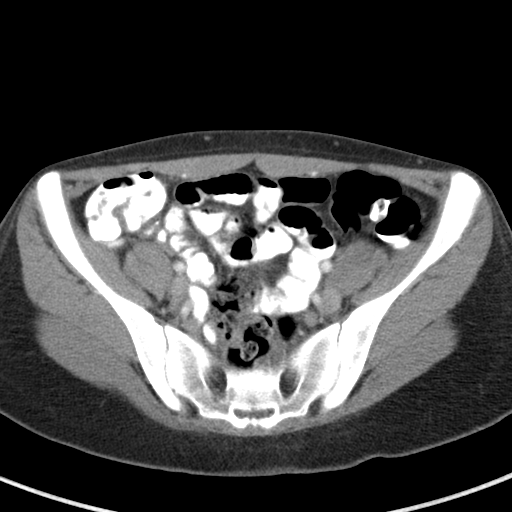
[im 34/82  soft-tissue]
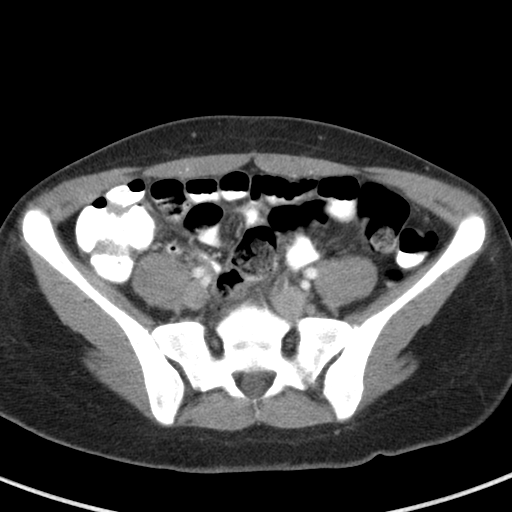
[im 42/82  soft-tissue]
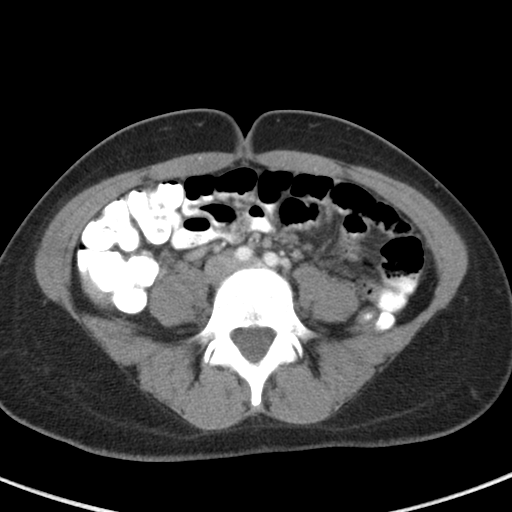
[im 48/82  soft-tissue]
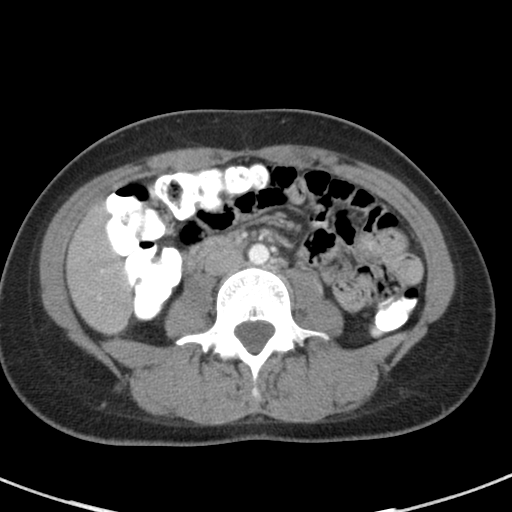
[im 53/82  soft-tissue]
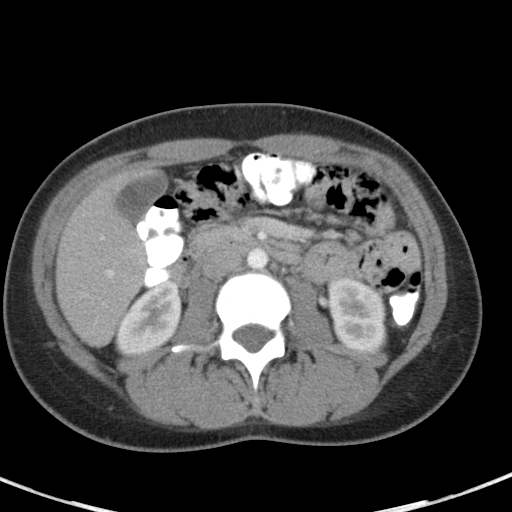
[im 53/82  bone]
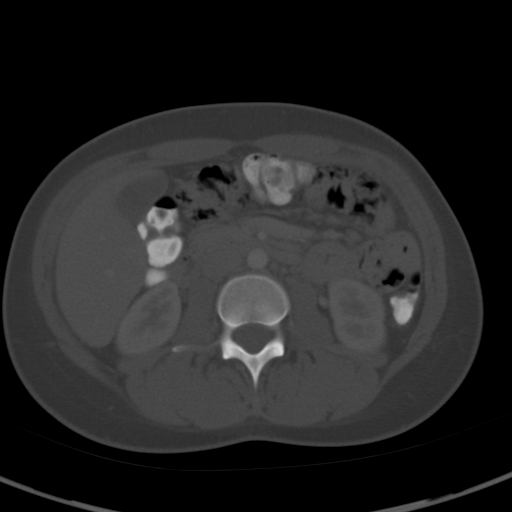
[im 58/82  soft-tissue]
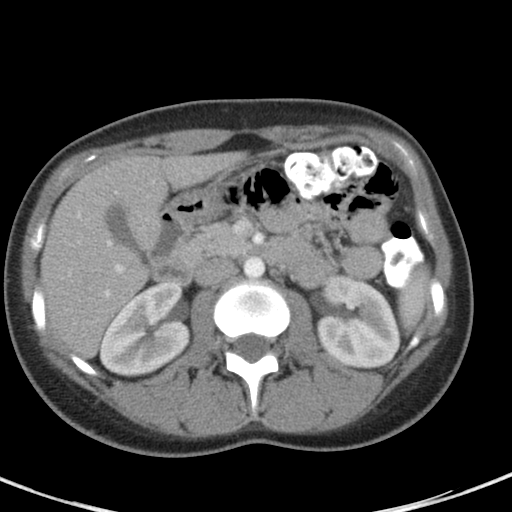
[im 66/82  soft-tissue]
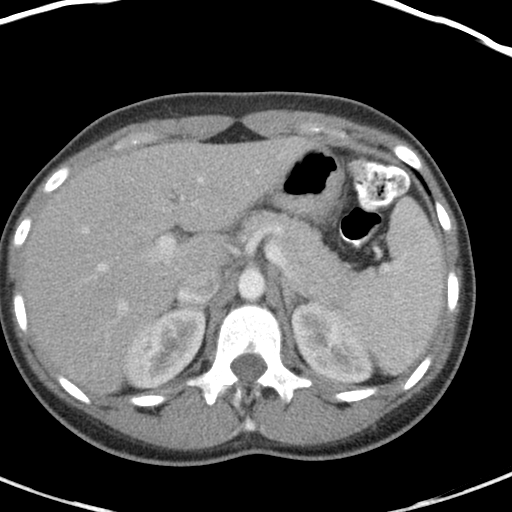
[im 71/82  soft-tissue]
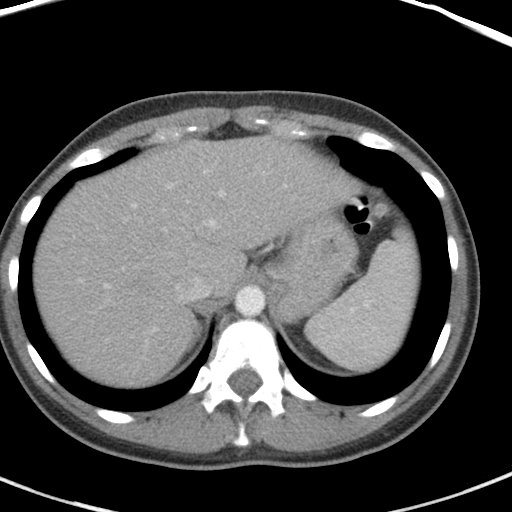
[im 76/82  soft-tissue]
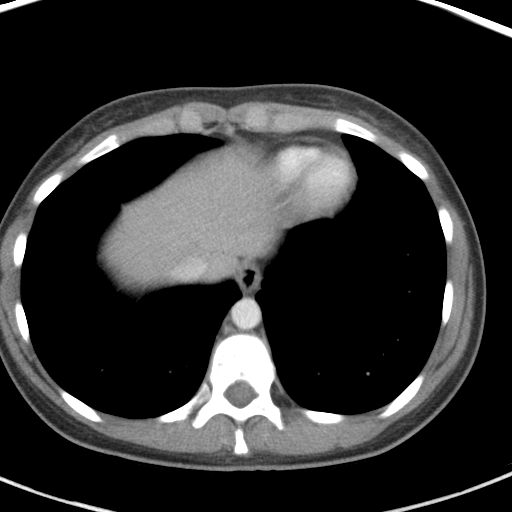

[Series 5: abd/pelvis 3.0 coronal · coronal · 0.59mm/px · 3 of 71 slices shown]
[im 24/71  soft-tissue]
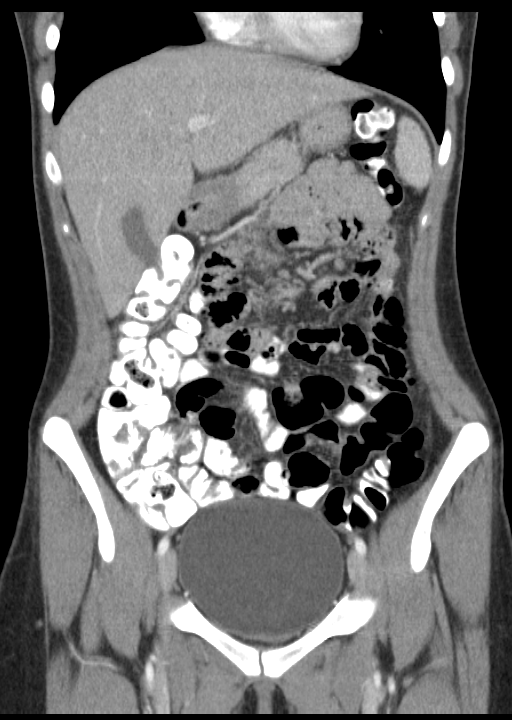
[im 32/71  soft-tissue]
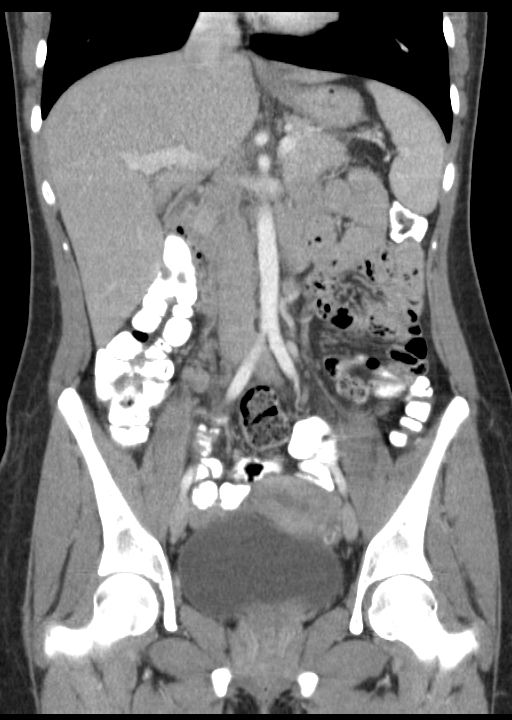
[im 39/71  soft-tissue]
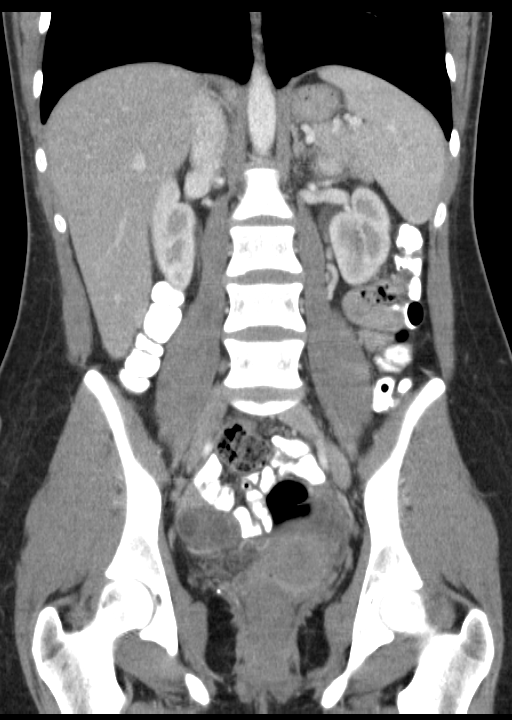

[16 of 46 positions shown; findings below may reference images not displayed]

FINDINGS: The lung bases are clear.  Liver, spleen, pancreas, and
adrenal glands normal.  No renal pathology.  No adenopathy  or
ascites.  No calcified gallstones or biliary dilatation.

No  mass or fluid collections.  The right ovary is larger than the
left. There are multiple follicles but no obvious pathology.  There
is no free pelvic fluid.

The appendix is elongated but normal.  No inflammatory changes of
the large or small bowel.  Osseous structures unremarkable.
IMPRESSION: No acute or significant findings noted in the abdomen or pelvis.

## 2011-12-07 ENCOUNTER — Encounter (HOSPITAL_COMMUNITY): Payer: Self-pay | Admitting: Emergency Medicine

## 2011-12-07 ENCOUNTER — Emergency Department (HOSPITAL_COMMUNITY)
Admission: EM | Admit: 2011-12-07 | Discharge: 2011-12-07 | Disposition: A | Discharge status: Home / Self Care | Payer: No Typology Code available for payment source | Attending: Emergency Medicine | Admitting: Emergency Medicine

## 2011-12-07 DIAGNOSIS — Z881 Allergy status to other antibiotic agents status: Secondary | ICD-10-CM | POA: Insufficient documentation

## 2011-12-07 DIAGNOSIS — Z888 Allergy status to other drugs, medicaments and biological substances status: Secondary | ICD-10-CM | POA: Insufficient documentation

## 2011-12-07 DIAGNOSIS — R000 Tachycardia, unspecified: Secondary | ICD-10-CM | POA: Insufficient documentation

## 2011-12-07 DIAGNOSIS — F411 Generalized anxiety disorder: Secondary | ICD-10-CM | POA: Insufficient documentation

## 2011-12-07 DIAGNOSIS — G8929 Other chronic pain: Secondary | ICD-10-CM

## 2011-12-07 DIAGNOSIS — M549 Dorsalgia, unspecified: Secondary | ICD-10-CM | POA: Insufficient documentation

## 2011-12-07 DIAGNOSIS — K219 Gastro-esophageal reflux disease without esophagitis: Secondary | ICD-10-CM | POA: Insufficient documentation

## 2011-12-07 DIAGNOSIS — IMO0002 Reserved for concepts with insufficient information to code with codable children: Secondary | ICD-10-CM | POA: Insufficient documentation

## 2011-12-07 DIAGNOSIS — M25559 Pain in unspecified hip: Secondary | ICD-10-CM | POA: Insufficient documentation

## 2011-12-07 DIAGNOSIS — F329 Major depressive disorder, single episode, unspecified: Secondary | ICD-10-CM | POA: Insufficient documentation

## 2011-12-07 DIAGNOSIS — M76899 Other specified enthesopathies of unspecified lower limb, excluding foot: Secondary | ICD-10-CM | POA: Insufficient documentation

## 2011-12-07 DIAGNOSIS — F3289 Other specified depressive episodes: Secondary | ICD-10-CM | POA: Insufficient documentation

## 2011-12-07 MED ORDER — OXYCODONE-ACETAMINOPHEN 5-325 MG PO TABS
1.0000 | ORAL_TABLET | Freq: Once | ORAL | Status: AC
  Administered 2011-12-07: 1 via ORAL
  Filled 2011-12-07: qty 1

## 2011-12-07 MED ORDER — GABAPENTIN 300 MG PO CAPS: 300.0000 mg | ORAL_CAPSULE | Freq: Three times a day (TID) | ORAL | Status: DC
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
## 2011-12-07 NOTE — ED Notes (Signed)
I gave the patient a large ice pack. 
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
## 2012-01-14 ENCOUNTER — Emergency Department (HOSPITAL_COMMUNITY)
Admission: EM | Admit: 2012-01-14 | Discharge: 2012-01-14 | Disposition: A | Discharge status: Home / Self Care | Payer: Self-pay | Attending: Emergency Medicine | Admitting: Emergency Medicine

## 2012-01-14 ENCOUNTER — Encounter (HOSPITAL_COMMUNITY): Payer: Self-pay | Admitting: Emergency Medicine

## 2012-01-14 ENCOUNTER — Emergency Department (HOSPITAL_COMMUNITY): Payer: Self-pay

## 2012-01-14 DIAGNOSIS — S7000XA Contusion of unspecified hip, initial encounter: Secondary | ICD-10-CM | POA: Insufficient documentation

## 2012-01-14 DIAGNOSIS — Y929 Unspecified place or not applicable: Secondary | ICD-10-CM | POA: Insufficient documentation

## 2012-01-14 DIAGNOSIS — S93409A Sprain of unspecified ligament of unspecified ankle, initial encounter: Secondary | ICD-10-CM | POA: Insufficient documentation

## 2012-01-14 DIAGNOSIS — Y9301 Activity, walking, marching and hiking: Secondary | ICD-10-CM | POA: Insufficient documentation

## 2012-01-14 DIAGNOSIS — Z8659 Personal history of other mental and behavioral disorders: Secondary | ICD-10-CM | POA: Insufficient documentation

## 2012-01-14 DIAGNOSIS — S79929A Unspecified injury of unspecified thigh, initial encounter: Secondary | ICD-10-CM | POA: Insufficient documentation

## 2012-01-14 DIAGNOSIS — S79919A Unspecified injury of unspecified hip, initial encounter: Secondary | ICD-10-CM | POA: Insufficient documentation

## 2012-01-14 DIAGNOSIS — R000 Tachycardia, unspecified: Secondary | ICD-10-CM | POA: Insufficient documentation

## 2012-01-14 DIAGNOSIS — Z79899 Other long term (current) drug therapy: Secondary | ICD-10-CM | POA: Insufficient documentation

## 2012-01-14 DIAGNOSIS — Z8719 Personal history of other diseases of the digestive system: Secondary | ICD-10-CM | POA: Insufficient documentation

## 2012-01-14 DIAGNOSIS — W010XXA Fall on same level from slipping, tripping and stumbling without subsequent striking against object, initial encounter: Secondary | ICD-10-CM | POA: Insufficient documentation

## 2012-01-14 DIAGNOSIS — IMO0002 Reserved for concepts with insufficient information to code with codable children: Secondary | ICD-10-CM | POA: Insufficient documentation

## 2012-01-14 DIAGNOSIS — Z8739 Personal history of other diseases of the musculoskeletal system and connective tissue: Secondary | ICD-10-CM | POA: Insufficient documentation

## 2012-01-14 MED ORDER — OXYCODONE-ACETAMINOPHEN 5-325 MG PO TABS
1.0000 | ORAL_TABLET | Freq: Once | ORAL | Status: AC
  Administered 2012-01-14: 1 via ORAL
  Filled 2012-01-14: qty 1

## 2012-01-14 MED ORDER — OXYCODONE-ACETAMINOPHEN 5-325 MG PO TABS: 1.0000 | ORAL_TABLET | ORAL | Status: DC | PRN
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
## 2012-01-14 NOTE — ED Provider Notes (Signed)
History     CSN: [id number]  Arrival date & time 01/14/12  1826   First MD Initiated Contact with Patient 01/14/12 1905      Chief Complaint  Patient presents with  . Fall  . Hip Pain  . Ankle Pain    (Consider location/radiation/quality/duration/timing/severity/associated sxs/prior treatment) Patient is a 26 y.o. female presenting with fall, hip pain, and ankle pain. The history is provided by the patient and the spouse.  Fall The accident occurred 3 to 5 hours ago. The fall occurred while walking. Pertinent negatives include no fever, no numbness, no abdominal pain, no headaches and no loss of consciousness. Associated symptoms comments: She stepped off a cement curb while not looking and inverted left ankle, falling onto right hip. She reports chronic issues of discomfort in the right hip with "bursitis" that is significantly worse since the fall..  Hip Pain Pertinent negatives include no abdominal pain, chills, fever, headaches, neck pain or numbness.  Ankle Pain  Pertinent negatives include no numbness.    Past Medical History  Diagnosis Date  . Tachycardia   . Reflux   . Anxiety   . Depression   . Chronic back pain   . Bursitis of right hip   . Bursitis of left hip   . Degenerative disk disease     Past Surgical History  Procedure Date  . Knee dislocation surgery   . Knee arthroscopy   . Wisdom tooth extraction   . Tonsillectomy   . Adenoidectomy     Family History  Problem Relation Age of Onset  . Diabetes Other   . Hypertension Other     History  Substance Use Topics  . Smoking status: Never Smoker   . Smokeless tobacco: Never Used  . Alcohol Use: No    OB History    Grav Para Term Preterm Abortions TAB SAB Ect Mult Living                  Review of Systems  Constitutional: Negative for fever and chills.  HENT: Negative.  Negative for neck pain.   Gastrointestinal: Negative.  Negative for abdominal pain.  Musculoskeletal:       See HPI.    Skin: Negative.  Negative for wound.  Neurological: Negative.  Negative for loss of consciousness, numbness and headaches.    Allergies  Azithromycin; Diphenhydramine hcl; Hydrocodone; Ibuprofen; Metoclopramide hcl; Penicillins; and Tramadol hcl  Home Medications   Current Outpatient Rx  Name  Route  Sig  Dispense  Refill  . CETIRIZINE HCL 10 MG PO TABS   Oral   Take 10 mg by mouth daily.         Marland Kitchen LEVONORGEST-ETH ESTRAD 91-DAY 0.15-0.03 MG PO TABS   Oral   Take 1 tablet by mouth daily.           Marland Kitchen METOPROLOL SUCCINATE ER 25 MG PO TB24   Oral   Take 25 mg by mouth daily.             BP 118/71  Pulse 98  Temp 98 F (36.7 C) (Oral)  Resp 16  SpO2 100%  LMP 12/26/2011  Physical Exam  Constitutional: She is oriented to person, place, and time. She appears well-developed and well-nourished.  Neck: Normal range of motion.  Pulmonary/Chest: Effort normal.  Abdominal: There is no tenderness.  Musculoskeletal:       Left ankle unremarkable in appearance. No swelling, discoloration or bony deformity. Tender to lateral malleolus. Joint stable.  Right hip tender to light palpation. No deformity. Minimal low back discomfort.  Neurological: She is alert and oriented to person, place, and time.  Skin: Skin is warm and dry.    ED Course  Procedures (including critical care time)  Labs Reviewed - No data to display Dg Ankle Complete Left  01/14/2012  *RADIOLOGY REPORT*  Clinical Data: Fall.  Twisting injury.  Pain.  LEFT ANKLE COMPLETE - 3+ VIEW  Comparison: None.  Findings: No fracture or dislocation.  IMPRESSION: No fracture.   Original Report Authenticated By: Lacy Duverney, M.D.    Dg Hip Complete Right  01/14/2012  *RADIOLOGY REPORT*  Clinical Data: : Fall, hip pain, bursitis  RIGHT HIP - COMPLETE 2+ VIEW  Comparison:  None.  Findings:  There is no evidence of hip fracture or dislocation. There is no evidence of arthropathy or other focal bone abnormality.  IMPRESSION:  Negative.   Original Report Authenticated By: Davonna Belling, M.D.    Dg Ankle Complete Left  01/14/2012  *RADIOLOGY REPORT*  Clinical Data: Fall.  Twisting injury.  Pain.  LEFT ANKLE COMPLETE - 3+ VIEW  Comparison: None.  Findings: No fracture or dislocation.  IMPRESSION: No fracture.   Original Report Authenticated By: Lacy Duverney, M.D.     No diagnosis found.  1. Fall 2. Ankle sprain 3. Hip contusion   MDM  Negative x-rays. Supportive care provided with pain medications. Refer to ortho made.        Rodena Medin, PA-C 01/14/12 2025

## 2012-01-14 NOTE — ED Notes (Signed)
Urine collected.

## 2012-01-14 NOTE — ED Notes (Signed)
Ortho tech at bedside 
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
## 2012-01-15 NOTE — ED Provider Notes (Signed)
Medical screening examination/treatment/procedure(s) were performed by non-physician practitioner and as supervising physician I was immediately available for consultation/collaboration.  Cherice Glennie R. Camiyah Friberg, MD 01/15/12 0011 

## 2012-01-22 ENCOUNTER — Emergency Department (HOSPITAL_COMMUNITY): Payer: Self-pay

## 2012-01-22 ENCOUNTER — Emergency Department (HOSPITAL_COMMUNITY)
Admission: EM | Admit: 2012-01-22 | Discharge: 2012-01-22 | Disposition: A | Discharge status: Home / Self Care | Payer: Self-pay | Attending: Emergency Medicine | Admitting: Emergency Medicine

## 2012-01-22 ENCOUNTER — Encounter (HOSPITAL_COMMUNITY): Payer: Self-pay | Admitting: Emergency Medicine

## 2012-01-22 DIAGNOSIS — G8929 Other chronic pain: Secondary | ICD-10-CM | POA: Insufficient documentation

## 2012-01-22 DIAGNOSIS — Z79899 Other long term (current) drug therapy: Secondary | ICD-10-CM | POA: Insufficient documentation

## 2012-01-22 DIAGNOSIS — Y929 Unspecified place or not applicable: Secondary | ICD-10-CM | POA: Insufficient documentation

## 2012-01-22 DIAGNOSIS — M549 Dorsalgia, unspecified: Secondary | ICD-10-CM | POA: Insufficient documentation

## 2012-01-22 DIAGNOSIS — Y9301 Activity, walking, marching and hiking: Secondary | ICD-10-CM | POA: Insufficient documentation

## 2012-01-22 DIAGNOSIS — F329 Major depressive disorder, single episode, unspecified: Secondary | ICD-10-CM | POA: Insufficient documentation

## 2012-01-22 DIAGNOSIS — X500XXA Overexertion from strenuous movement or load, initial encounter: Secondary | ICD-10-CM | POA: Insufficient documentation

## 2012-01-22 DIAGNOSIS — S93402A Sprain of unspecified ligament of left ankle, initial encounter: Secondary | ICD-10-CM

## 2012-01-22 DIAGNOSIS — S93409A Sprain of unspecified ligament of unspecified ankle, initial encounter: Secondary | ICD-10-CM | POA: Insufficient documentation

## 2012-01-22 DIAGNOSIS — K219 Gastro-esophageal reflux disease without esophagitis: Secondary | ICD-10-CM | POA: Insufficient documentation

## 2012-01-22 DIAGNOSIS — F411 Generalized anxiety disorder: Secondary | ICD-10-CM | POA: Insufficient documentation

## 2012-01-22 DIAGNOSIS — F3289 Other specified depressive episodes: Secondary | ICD-10-CM | POA: Insufficient documentation

## 2012-01-22 DIAGNOSIS — R209 Unspecified disturbances of skin sensation: Secondary | ICD-10-CM | POA: Insufficient documentation

## 2012-01-22 DIAGNOSIS — M1990 Unspecified osteoarthritis, unspecified site: Secondary | ICD-10-CM | POA: Insufficient documentation

## 2012-01-22 NOTE — ED Provider Notes (Signed)
History     CSN: [id number]  Arrival date & time 01/22/12  1900   First MD Initiated Contact with Patient 01/22/12 1911      Chief Complaint  Patient presents with  . Ankle Injury    (Consider location/radiation/quality/duration/timing/severity/associated sxs/prior treatment) HPI  26 year old female w/ hx of chronic back pain, anxiety, and DDD presents complaining of left ankle injury. Patient reports she injured her left ankle a week ago in which she was evaluated in the ED and was giving a brace for a possible sprain. 3 days ago she was not paying attention while walking and accidentally fell forward and twisted her left ankle again.  C/o sharp pain to lateral aspect of ankle, wax and wane worsening with walking, moderate in severity, and improves with rest.  Also notices tingling sensation to toes which got her concerned.  Has been taking oxycodone and advil with some relief.  Has ankle brace and crutches available.  Denies knee or hip pain.     Past Medical History  Diagnosis Date  . Tachycardia   . Reflux   . Anxiety   . Depression   . Chronic back pain   . Bursitis of right hip   . Bursitis of left hip   . Degenerative disk disease     Past Surgical History  Procedure Date  . Knee dislocation surgery   . Knee arthroscopy   . Wisdom tooth extraction   . Tonsillectomy   . Adenoidectomy     Family History  Problem Relation Age of Onset  . Diabetes Other   . Hypertension Other     History  Substance Use Topics  . Smoking status: Never Smoker   . Smokeless tobacco: Never Used  . Alcohol Use: No    OB History    Grav Para Term Preterm Abortions TAB SAB Ect Mult Living                  Review of Systems  Constitutional: Negative for fever.  Musculoskeletal: Negative for back pain and joint swelling.  Skin: Negative for rash and wound.  Neurological: Positive for numbness.    Allergies  Azithromycin; Diphenhydramine hcl; Hydrocodone; Ibuprofen;  Metoclopramide hcl; Penicillins; and Tramadol hcl  Home Medications   Current Outpatient Rx  Name  Route  Sig  Dispense  Refill  . CETIRIZINE HCL 10 MG PO TABS   Oral   Take 10 mg by mouth daily.         Marland Kitchen LEVONORGEST-ETH ESTRAD 91-DAY 0.15-0.03 MG PO TABS   Oral   Take 1 tablet by mouth daily.           Marland Kitchen METOPROLOL SUCCINATE ER 25 MG PO TB24   Oral   Take 25 mg by mouth daily.           . OXYCODONE-ACETAMINOPHEN 5-325 MG PO TABS   Oral   Take 1 tablet by mouth every 4 (four) hours as needed for pain.   20 tablet   0     BP 129/73  Pulse 80  Temp 97.7 F (36.5 C) (Oral)  Resp 18  SpO2 99%  LMP 12/26/2011  Physical Exam  Nursing note and vitals reviewed. Constitutional: She appears well-developed and well-nourished. She appears distressed (tearful).  HENT:  Head: Normocephalic and atraumatic.  Eyes: Conjunctivae normal are normal.  Neck: Neck supple.  Musculoskeletal:       Left hip: Normal.       Left knee: Normal.  Left ankle: She exhibits decreased range of motion. She exhibits no swelling, no ecchymosis, no deformity and normal pulse. tenderness. Lateral malleolus tenderness found. No medial malleolus, no AITFL, no CF ligament, no posterior TFL, no head of 5th metatarsal and no proximal fibula tenderness found. Achilles tendon normal.       L ankle:  DP and PT pulses palpable.  Normal sensation to sharp and soft touch to all toes.  Brisk cap refill.    No tenderness to head of 5th MT worrisome for Jones fx.  Skin: Skin is warm. No rash noted.  Psychiatric: She has a normal mood and affect.    ED Course  Procedures (including critical care time)  Labs Reviewed - No data to display No results found.   No diagnosis found.  Results for orders placed during the hospital encounter of 02/19/11  PREGNANCY, URINE      Component Value Range   Preg Test, Ur NEGATIVE    URINALYSIS, ROUTINE W REFLEX MICROSCOPIC      Component Value Range   Color,  Urine YELLOW  YELLOW   APPearance CLEAR  CLEAR   Specific Gravity, Urine 1.023  1.005 - 1.030   pH 6.0  5.0 - 8.0   Glucose, UA NEGATIVE  NEGATIVE mg/dL   Hgb urine dipstick NEGATIVE  NEGATIVE   Bilirubin Urine NEGATIVE  NEGATIVE   Ketones, ur NEGATIVE  NEGATIVE mg/dL   Protein, ur NEGATIVE  NEGATIVE mg/dL   Urobilinogen, UA 1.0  0.0 - 1.0 mg/dL   Nitrite NEGATIVE  NEGATIVE   Leukocytes, UA NEGATIVE  NEGATIVE  CBC      Component Value Range   WBC 11.0 (*) 4.0 - 10.5 K/uL   RBC 5.00  3.87 - 5.11 MIL/uL   Hemoglobin 14.8  12.0 - 15.0 g/dL   HCT 96.0  45.4 - 09.8 %   MCV 87.0  78.0 - 100.0 fL   MCH 29.6  26.0 - 34.0 pg   MCHC 34.0  30.0 - 36.0 g/dL   RDW 11.9  14.7 - 82.9 %   Platelets 195  150 - 400 K/uL  DIFFERENTIAL      Component Value Range   Neutrophils Relative 78 (*) 43 - 77 %   Neutro Abs 8.6 (*) 1.7 - 7.7 K/uL   Lymphocytes Relative 17  12 - 46 %   Lymphs Abs 1.8  0.7 - 4.0 K/uL   Monocytes Relative 5  3 - 12 %   Monocytes Absolute 0.6  0.1 - 1.0 K/uL   Eosinophils Relative 0  0 - 5 %   Eosinophils Absolute 0.0  0.0 - 0.7 K/uL   Basophils Relative 0  0 - 1 %   Basophils Absolute 0.0  0.0 - 0.1 K/uL  BASIC METABOLIC PANEL      Component Value Range   Sodium 139  135 - 145 mEq/L   Potassium 3.8  3.5 - 5.1 mEq/L   Chloride 105  96 - 112 mEq/L   CO2 21  19 - 32 mEq/L   Glucose, Bld 98  70 - 99 mg/dL   BUN 16  6 - 23 mg/dL   Creatinine, Ser 5.62  0.50 - 1.10 mg/dL   Calcium 9.9  8.4 - 13.0 mg/dL   GFR calc non Af Amer 89 (*) >90 mL/min   GFR calc Af Amer >90  >90 mL/min   Dg Hip Complete Right  01/14/2012  *RADIOLOGY REPORT*  Clinical Data: : Fall, hip pain,  bursitis  RIGHT HIP - COMPLETE 2+ VIEW  Comparison:  None.  Findings:  There is no evidence of hip fracture or dislocation. There is no evidence of arthropathy or other focal bone abnormality.  IMPRESSION: Negative.   Original Report Authenticated By: Davonna Belling, M.D.    Dg Ankle Complete Left  01/22/2012   *RADIOLOGY REPORT*  Clinical Data: History of fall complaining of left ankle pain.  LEFT ANKLE COMPLETE - 3+ VIEW  Comparison: 01/14/2012.  Findings: Three views of the left ankle demonstrate no acute displaced fracture, subluxation, dislocation, joint or soft tissue abnormality.  IMPRESSION: 1.  No acute radiographic abnormality of the left ankle.   Original Report Authenticated By: Trudie Reed, M.D.    Dg Ankle Complete Left  01/14/2012  *RADIOLOGY REPORT*  Clinical Data: Fall.  Twisting injury.  Pain.  LEFT ANKLE COMPLETE - 3+ VIEW  Comparison: None.  Findings: No fracture or dislocation.  IMPRESSION: No fracture.   Original Report Authenticated By: Lacy Duverney, M.D.     1. L ankle sprain  MDM  C/o recurrent L ankle injury, most recently 2 days ago.  Has braces and crutches available.  Was complaining of tingling sensation to toes.  On evaluation, no deformity noted, NVI, no swelling.  Xray of L ankle unremarkable.  Reassurance given.  Recommend f/u with her orthopedist, Dr. Eulah Pont.  7:45 PM Xray of L ankle is unremarkable.  Pt is NVI.  i recommend f/u with Dr. Eulah Pont for further care.  RICE therapy discussed.  Pt can continue using ASO and crutches as needed.        Fayrene Helper, PA-C 01/22/12 2012

## 2012-01-22 NOTE — ED Notes (Signed)
Pt reports hurting L ankle last Saturday and came to ED, on Wednesday, fell forward and twisted ankle again; pt still with brace on but having tingling feeling

## 2012-01-22 NOTE — ED Notes (Signed)
Pt noted to be walking out of department, asked if patient received discharge instructions and pt did not respond. Pt family stated she was leaving. Pt did not stay for discharge instructions.

## 2012-01-22 NOTE — ED Provider Notes (Signed)
Medical screening examination/treatment/procedure(s) were performed by non-physician practitioner and as supervising physician I was immediately available for consultation/collaboration.   Ernisha Sorn, MD 01/22/12 2342 

## 2012-05-30 ENCOUNTER — Emergency Department (HOSPITAL_COMMUNITY): Payer: Self-pay

## 2012-05-30 ENCOUNTER — Emergency Department (HOSPITAL_COMMUNITY)
Admission: EM | Admit: 2012-05-30 | Discharge: 2012-05-30 | Disposition: A | Discharge status: Home / Self Care | Payer: Self-pay | Attending: Emergency Medicine | Admitting: Emergency Medicine

## 2012-05-30 DIAGNOSIS — Z9889 Other specified postprocedural states: Secondary | ICD-10-CM | POA: Insufficient documentation

## 2012-05-30 DIAGNOSIS — M25561 Pain in right knee: Secondary | ICD-10-CM

## 2012-05-30 DIAGNOSIS — G8929 Other chronic pain: Secondary | ICD-10-CM | POA: Insufficient documentation

## 2012-05-30 DIAGNOSIS — Z8659 Personal history of other mental and behavioral disorders: Secondary | ICD-10-CM | POA: Insufficient documentation

## 2012-05-30 DIAGNOSIS — W010XXA Fall on same level from slipping, tripping and stumbling without subsequent striking against object, initial encounter: Secondary | ICD-10-CM | POA: Insufficient documentation

## 2012-05-30 DIAGNOSIS — Z8739 Personal history of other diseases of the musculoskeletal system and connective tissue: Secondary | ICD-10-CM | POA: Insufficient documentation

## 2012-05-30 DIAGNOSIS — Z79899 Other long term (current) drug therapy: Secondary | ICD-10-CM | POA: Insufficient documentation

## 2012-05-30 DIAGNOSIS — Y9301 Activity, walking, marching and hiking: Secondary | ICD-10-CM | POA: Insufficient documentation

## 2012-05-30 DIAGNOSIS — Z8719 Personal history of other diseases of the digestive system: Secondary | ICD-10-CM | POA: Insufficient documentation

## 2012-05-30 DIAGNOSIS — S8990XA Unspecified injury of unspecified lower leg, initial encounter: Secondary | ICD-10-CM | POA: Insufficient documentation

## 2012-05-30 DIAGNOSIS — Y9289 Other specified places as the place of occurrence of the external cause: Secondary | ICD-10-CM | POA: Insufficient documentation

## 2012-05-30 DIAGNOSIS — X500XXA Overexertion from strenuous movement or load, initial encounter: Secondary | ICD-10-CM | POA: Insufficient documentation

## 2012-05-30 MED ORDER — OXYCODONE-ACETAMINOPHEN 5-325 MG PO TABS
1.0000 | ORAL_TABLET | Freq: Once | ORAL | Status: AC
  Administered 2012-05-30: 1 via ORAL
  Filled 2012-05-30: qty 1

## 2012-05-30 MED ORDER — DICLOFENAC SODIUM 75 MG PO TBEC: 75.0000 mg | DELAYED_RELEASE_TABLET | Freq: Two times a day (BID) | ORAL | Status: DC

## 2012-05-30 NOTE — ED Notes (Signed)
Pt states she was moving a mattress on Sunday and fell on some steps. Pt states she injured her R knee. Pt reports that she has had prior surgeries on that same knee. Pt ambulatory to exam room with steady gait. Pt has a ride home.

## 2012-05-30 NOTE — ED Notes (Signed)
Ortho tech called for application of knee immobilizer and crutches.  

## 2012-05-30 NOTE — ED Provider Notes (Signed)
History  This chart was scribed for non-physician practitioner working with Laray Anger, DO by Ardeen Jourdain, ED Scribe. This patient was seen in room WTR7/WTR7 and the patient's care was started at 2142.  CSN: [id number]  Arrival date & time 05/30/12  2128   First MD Initiated Contact with Patient 05/30/12 2142      Chief Complaint  Patient presents with  . Knee Pain     Patient is a 27 y.o. female presenting with knee pain. The history is provided by the patient. No language interpreter was used.  Knee Pain Location:  Knee Time since incident:  4 days Injury: yes   Mechanism of injury: fall   Fall:    Fall occurred:  Walking   Impact surface:  Hard floor (Stairs)   Point of impact:  Knees   Entrapped after fall: no   Knee location:  R knee Pain details:    Radiates to:  Does not radiate   Severity:  Moderate   Onset quality:  Sudden   Timing:  Constant   Progression:  Worsening Chronicity:  New Dislocation: no   Foreign body present:  No foreign bodies Prior injury to area:  Yes Relieved by:  None tried Worsened by:  Nothing tried Ineffective treatments:  None tried Associated symptoms: decreased ROM and stiffness   Associated symptoms: no back pain, no fatigue, no fever, no muscle weakness, no neck pain, no numbness, no swelling and no tingling     Adriana Galvan is a 27 y.o. female who presents to the Emergency Department complaining of sudden onset, gradually worsening, constant right knee pain that began 4 days ago after a fall. She states she was moving a mattress when she tripped and fell onto the stairs. She reports feeling a "pop" when she fell. She reports a h/o surgeries on the right knee. She is able to ambulate. She denies any other symptoms or injuries at this time.    Past Medical History  Diagnosis Date  . Tachycardia   . Reflux   . Anxiety   . Depression   . Chronic back pain   . Bursitis of right hip   . Bursitis of left hip   .  Degenerative disk disease     Past Surgical History  Procedure Laterality Date  . Knee dislocation surgery    . Knee arthroscopy    . Wisdom tooth extraction    . Tonsillectomy    . Adenoidectomy      Family History  Problem Relation Age of Onset  . Diabetes Other   . Hypertension Other     History  Substance Use Topics  . Smoking status: Never Smoker   . Smokeless tobacco: Never Used  . Alcohol Use: No   No OB history available.  Review of Systems  Constitutional: Negative for fever, chills and fatigue.  HENT: Negative for neck pain.   Respiratory: Negative for shortness of breath.   Gastrointestinal: Negative for nausea and vomiting.  Musculoskeletal: Positive for stiffness. Negative for back pain.       Right knee pain  Neurological: Negative for weakness.  All other systems reviewed and are negative.    Allergies  Azithromycin; Diphenhydramine hcl; Hydrocodone; Ibuprofen; Metoclopramide hcl; Penicillins; and Tramadol hcl  Home Medications   Current Outpatient Rx  Name  Route  Sig  Dispense  Refill  . acetaminophen (TYLENOL) 325 MG tablet   Oral   Take 650 mg by mouth every 6 (six) hours  as needed for pain.         . cetirizine (ZYRTEC) 10 MG tablet   Oral   Take 10 mg by mouth daily.         Marland Kitchen levonorgestrel-ethinyl estradiol (SEASONALE) 0.15-0.03 MG per tablet   Oral   Take 1 tablet by mouth daily.           . metoprolol succinate (TOPROL-XL) 25 MG 24 hr tablet   Oral   Take 25 mg by mouth daily.           . diclofenac (VOLTAREN) 75 MG EC tablet   Oral   Take 1 tablet (75 mg total) by mouth 2 (two) times daily.   14 tablet   0     Triage Vitals: BP 132/80  Pulse 80  Temp(Src) 98.9 F (37.2 C) (Oral)  Resp 18  SpO2 96%  LMP 05/23/2012  Physical Exam  Nursing note and vitals reviewed. Constitutional: She is oriented to person, place, and time. She appears well-developed and well-nourished. No distress.  HENT:  Head:  Normocephalic and atraumatic.  Eyes: EOM are normal. Pupils are equal, round, and reactive to light.  Neck: Normal range of motion. Neck supple. No tracheal deviation present.  Cardiovascular: Normal rate.   Pulmonary/Chest: Effort normal. No respiratory distress.  Abdominal: Soft. She exhibits no distension.  Musculoskeletal: Normal range of motion. She exhibits no edema.  Neurological: She is alert and oriented to person, place, and time.  Skin: Skin is warm and dry.  Psychiatric: She has a normal mood and affect. Her behavior is normal.    ED Course  Procedures (including critical care time)  DIAGNOSTIC STUDIES: Oxygen Saturation is 96% on room air, adequate by my interpretation.    COORDINATION OF CARE:  10:07 PM: Discussed treatment plan which includes right knee x-ray  with pt at bedside and pt agreed to plan.   10:20 PM: Pt looked up on drug database, gets 30 days of percocet prescription from Dr. Roseanne Reno. Last prescription filled 05/17/12 for 60 pills  10:24 PM: Pt informed of discharge. She states she "lost her bottle of percocet." Pt given 1 percocet in ED. Pt given no prescription for narcotics   10:28 PM: Medication Orders: oxyCODONE-acetaminophen (PERCOCET/ROXICET) 5-325 MG per tablet 1 tablet Once, Prescription: diclofenac (VOLTAREN) 75 MG EC tablet 2 times daily   Labs Reviewed - No data to display Dg Knee Complete 4 Views Right  05/30/2012  *RADIOLOGY REPORT*  Clinical Data: Knee pain  RIGHT KNEE - COMPLETE 4+ VIEW  Comparison: 12/02/2009  Findings: No acute fracture and no dislocation.  Unremarkable soft tissues.  IMPRESSION: No acute bony pathology.   Original Report Authenticated By: Jolaine Click, M.D.      1. Knee pain, acute, right       MDM  Pt is a drug seeker. Multiple pain complaint visits. Did not tell me that she gets refills frequently.  Pt has been advised of the symptoms that warrant their return to the ED. Patient has voiced understanding and has  agreed to follow-up with the PCP or specialist.  I personally performed the services described in this documentation, which was scribed in my presence. The recorded information has been reviewed and is accurate.    Dorthula Matas, PA-C 05/30/12 2306

## 2012-05-31 NOTE — ED Provider Notes (Signed)
Medical screening examination/treatment/procedure(s) were performed by non-physician practitioner and as supervising physician I was immediately available for consultation/collaboration.   Dakiyah Heinke M Ruthel Martine, DO 05/31/12 2036 

## 2012-11-14 ENCOUNTER — Encounter (HOSPITAL_COMMUNITY): Payer: Self-pay | Admitting: Emergency Medicine

## 2012-11-14 ENCOUNTER — Emergency Department (HOSPITAL_COMMUNITY)
Admission: EM | Admit: 2012-11-14 | Discharge: 2012-11-14 | Disposition: A | Discharge status: Home / Self Care | Payer: Self-pay | Attending: Emergency Medicine | Admitting: Emergency Medicine

## 2012-11-14 ENCOUNTER — Emergency Department (HOSPITAL_COMMUNITY): Payer: No Typology Code available for payment source

## 2012-11-14 DIAGNOSIS — Z8659 Personal history of other mental and behavioral disorders: Secondary | ICD-10-CM | POA: Insufficient documentation

## 2012-11-14 DIAGNOSIS — Z8719 Personal history of other diseases of the digestive system: Secondary | ICD-10-CM | POA: Insufficient documentation

## 2012-11-14 DIAGNOSIS — W1809XA Striking against other object with subsequent fall, initial encounter: Secondary | ICD-10-CM | POA: Insufficient documentation

## 2012-11-14 DIAGNOSIS — T148XXA Other injury of unspecified body region, initial encounter: Secondary | ICD-10-CM

## 2012-11-14 DIAGNOSIS — Y9289 Other specified places as the place of occurrence of the external cause: Secondary | ICD-10-CM | POA: Insufficient documentation

## 2012-11-14 DIAGNOSIS — G8929 Other chronic pain: Secondary | ICD-10-CM | POA: Insufficient documentation

## 2012-11-14 DIAGNOSIS — Z8739 Personal history of other diseases of the musculoskeletal system and connective tissue: Secondary | ICD-10-CM | POA: Insufficient documentation

## 2012-11-14 DIAGNOSIS — M79604 Pain in right leg: Secondary | ICD-10-CM

## 2012-11-14 DIAGNOSIS — Z9889 Other specified postprocedural states: Secondary | ICD-10-CM | POA: Insufficient documentation

## 2012-11-14 DIAGNOSIS — Y9389 Activity, other specified: Secondary | ICD-10-CM | POA: Insufficient documentation

## 2012-11-14 DIAGNOSIS — S8010XA Contusion of unspecified lower leg, initial encounter: Secondary | ICD-10-CM | POA: Insufficient documentation

## 2012-11-14 DIAGNOSIS — Z880 Allergy status to penicillin: Secondary | ICD-10-CM | POA: Insufficient documentation

## 2012-11-14 MED ORDER — OXYCODONE-ACETAMINOPHEN 5-325 MG PO TABS
1.0000 | ORAL_TABLET | Freq: Once | ORAL | Status: AC
  Administered 2012-11-14: 1 via ORAL
  Filled 2012-11-14: qty 1

## 2012-11-14 MED ORDER — OXYCODONE-ACETAMINOPHEN 5-325 MG PO TABS: 1.0000 | ORAL_TABLET | Freq: Once | ORAL | Status: DC

## 2012-11-14 NOTE — ED Notes (Addendum)
Was bring in groceries and slipped and landed rt hip and hurt rt  Lower leg  Bump to lower leg toes are cool foot has good pulse and she  Can wiggle toes

## 2012-11-14 NOTE — ED Notes (Signed)
Pt slipped and fell injuring right lower leg and right hip. Pt tearful. Swelling to right lower leg.

## 2012-11-14 NOTE — ED Provider Notes (Signed)
CSN: [id number]     Arrival date & time 11/14/12  1335 History   This chart was scribed for non-physician practitioner Raymon Mutton, PA-C, working with Shelda Jakes, MD by Dorothey Baseman, ED Scribe. This patient was seen in room TR06C/TR06C and the patient's care was started at 3:41 PM.    Chief Complaint  Patient presents with  . Leg Pain   Patient is a 27 y.o. female presenting with leg pain. The history is provided by the patient. No language interpreter was used.  Leg Pain  HPI Comments: Adriana Galvan is a 27 y.o. female who presents to the Emergency Department complaining of right leg pain to the anterior aspect described as a burning, throbbing sensation that occurred secondary to falling on some stairs while carrying groceries around 2 hours ago. She states that she fell on the right-side of her hip and hit her right leg on some hardwood. She reports the pain is exacerbated with movement and applying pressure.  She denies hitting her head or loss of consciousness. She denies numbness, paresthesias, loss of sensation, weakness, drop foot, or pain in the ankle and foot, chest pain, or shortness of breath. She received oxycodone in the ED with relief. Patient reports allergies to NSAIDs that causes urticarial rashes.  Past Medical History  Diagnosis Date  . Tachycardia   . Reflux   . Anxiety   . Depression   . Chronic back pain   . Bursitis of right hip   . Bursitis of left hip   . Degenerative disk disease    Past Surgical History  Procedure Laterality Date  . Knee dislocation surgery    . Knee arthroscopy    . Wisdom tooth extraction    . Tonsillectomy    . Adenoidectomy     Family History  Problem Relation Age of Onset  . Diabetes Other   . Hypertension Other    History  Substance Use Topics  . Smoking status: Never Smoker   . Smokeless tobacco: Never Used  . Alcohol Use: No   OB History   Grav Para Term Preterm Abortions TAB SAB Ect Mult Living                  Review of Systems  Respiratory: Negative for shortness of breath.   Cardiovascular: Negative for chest pain.  Musculoskeletal: Positive for myalgias.  Neurological: Negative for weakness and numbness.  All other systems reviewed and are negative.    Allergies  Azithromycin; Diphenhydramine hcl; Hydrocodone; Ibuprofen; Metoclopramide hcl; Penicillins; and Tramadol hcl  Home Medications   Current Outpatient Rx  Name  Route  Sig  Dispense  Refill  . metoprolol succinate (TOPROL-XL) 25 MG 24 hr tablet   Oral   Take 25 mg by mouth daily.            Triage Vitals: BP 135/99  Pulse 84  Temp(Src) 98.3 F (36.8 C) (Oral)  Resp 18  Ht 5\' 4"  (1.626 m)  Wt 118 lb (53.524 kg)  BMI 20.24 kg/m2  SpO2 99%  LMP 10/22/2012  Physical Exam  Nursing note and vitals reviewed. Constitutional: She is oriented to person, place, and time. She appears well-developed and well-nourished. No distress.  Patient sitting in chair comfortably. Mildly tearful upon exam.   HENT:  Head: Normocephalic and atraumatic.  Negative trauma noted to face  Neck: Normal range of motion. Neck supple.  Cardiovascular: Normal rate, regular rhythm and normal heart sounds.   Pulses:  Radial pulses are 2+ on the right side, and 2+ on the left side.       Dorsalis pedis pulses are 2+ on the right side, and 2+ on the left side.  Pulmonary/Chest: Effort normal and breath sounds normal. No respiratory distress.  Musculoskeletal: Normal range of motion. She exhibits tenderness.       Legs: Tenderness to palpation to the anterior aspect of the mid tib/fib region of the right leg. Mild swelling noted without erythema. Beginnings of ecchymosis identified, hematoma.  Full range of motion to lower extremities bilaterally without difficulty.   Neurological: She is alert and oriented to person, place, and time.  Patient is able to ambulated with mild limping noted upon weight bearing to right leg. Strength 5+/5+ to  lower extremities bilaterally with resistance. Equal distribution identified. Sensation intact.   Skin: Skin is warm and dry. She is not diaphoretic. No erythema.  Beginnings of ecchymosis noted to the anterior aspect of mid tib/fib of right leg. Negative signs of laceration, infection, severe trauma.   Psychiatric: She has a normal mood and affect. Her behavior is normal.    ED Course  Procedures (including critical care time)  Medications  oxyCODONE-acetaminophen (PERCOCET/ROXICET) 5-325 MG per tablet 1 tablet (1 tablet Oral Given 11/14/12 1405)   DIAGNOSTIC STUDIES: Oxygen Saturation is 99% on room air, normal by my interpretation.    COORDINATION OF CARE: 3:45PM- Discussed that x-rays show no fractures, but advised that pain is likely due to a hematoma or bruised bone. Will order an ACE bandage. Advised patient to take Tylenol at home as needed and follow RICE procedures. Advised patient to return to the ED if there are any new or worsening symptoms, especially loss of sensation or paresthesias. Discussed treatment plan with patient at bedside and patient verbalized agreement.   Labs Review Labs Reviewed - No data to display  Imaging Review Dg Hip Complete Right  11/14/2012   CLINICAL DATA:  Fall  EXAM: RIGHT HIP - COMPLETE 2+ VIEW  COMPARISON:  None.  FINDINGS: There is no evidence of hip fracture or dislocation. There is no evidence of arthropathy or other focal bone abnormality.  IMPRESSION: Negative.   Electronically Signed   By: Marlan Palau M.D.   On: 11/14/2012 14:38   Dg Tibia/fibula Right  11/14/2012   CLINICAL DATA:  Fall  EXAM: RIGHT TIBIA AND FIBULA - 2 VIEW  COMPARISON:  None.  FINDINGS: There is no evidence of fracture or other focal bone lesions. Soft tissues are unremarkable.  IMPRESSION: Negative.   Electronically Signed   By: Marlan Palau M.D.   On: 11/14/2012 14:37    MDM   1. Hematoma   2. Right leg pain   3. Contusion of bone    I personally performed  the services described in this documentation, which was scribed in my presence. The recorded information has been reviewed and is accurate.  Patient presenting to the ED with right leg pain that started approximately 2 hours ago when patient fell up the steps when carrying groceries. Patient reported that she landed on her right side and in the process hit her anterior aspect of the right tib/fib region on the edge of the hardwood step. Patient reported that there is a constant burning, throbbing sensation to the area where the trauma occurred. Patient reported that the pain is mainly to the mid tib/fib region without radiation. Patient reported that pain worsens with motion, nothing makes the pain better - reported that  the oxycodone given in the ED setting helped dull the pain. Denied numbness, tingling, drop foot, weakness, head injury, LOC.  Alert and oriented. Beginnings of ecchymosis noted to the anterior aspect of the mid tibia-fibula region of the right leg - hematoma palpated. Pain upon palpation. Discomfort upon palpation to the right hip - patient reported that this is chronic, reported that she has bursitis and that she has had hip pain for years. Full ROM to the right lower extremity. Strength intact with equal distribution identified. Pulses palpable. Patient ambulated, discomfort noted when bearing weight to the right leg. Negative neurological deficits noted.  Plain films of right hip and tibia/fibula negative acute abnormalities noted - negative fractures and dislocations noted.  Patient stable, afebrile. Suspicion for pain secondary to hematoma and bone bruising from trauma to the anterior aspect of the tibia/fibula of right leg. Patient placed in ace bandage for comfort. Discussed with patient to rest, ice and elevate. Referred [facility] to [facility] and orthopedics. Discussed with patient to use Tylenol as needed for discomfort relief. Discussed with patient to avoid strenuous  activity. Discussed with patient to monitor symptoms and if symptoms are to worsen or change to report back to the ED - strict return instructions given.  Patient agreed to plan of care, understood, all questions answered.   Raymon Mutton, PA-C 11/14/12 1847

## 2012-11-14 NOTE — ED Notes (Signed)
PT IS ALLERGIC TO HYDROCODONE.

## 2012-11-16 NOTE — ED Provider Notes (Signed)
Medical screening examination/treatment/procedure(s) were performed by non-physician practitioner and as supervising physician I was immediately available for consultation/collaboration.   Shelda Jakes, MD 11/16/12 [phone]

## 2012-12-02 DIAGNOSIS — G8929 Other chronic pain: Secondary | ICD-10-CM | POA: Insufficient documentation

## 2012-12-02 DIAGNOSIS — Z8659 Personal history of other mental and behavioral disorders: Secondary | ICD-10-CM | POA: Insufficient documentation

## 2012-12-02 DIAGNOSIS — Z3202 Encounter for pregnancy test, result negative: Secondary | ICD-10-CM | POA: Insufficient documentation

## 2012-12-02 DIAGNOSIS — N12 Tubulo-interstitial nephritis, not specified as acute or chronic: Secondary | ICD-10-CM | POA: Insufficient documentation

## 2012-12-02 DIAGNOSIS — Z79899 Other long term (current) drug therapy: Secondary | ICD-10-CM | POA: Insufficient documentation

## 2012-12-02 DIAGNOSIS — Z880 Allergy status to penicillin: Secondary | ICD-10-CM | POA: Insufficient documentation

## 2012-12-02 DIAGNOSIS — M549 Dorsalgia, unspecified: Secondary | ICD-10-CM | POA: Insufficient documentation

## 2012-12-02 DIAGNOSIS — Z8719 Personal history of other diseases of the digestive system: Secondary | ICD-10-CM | POA: Insufficient documentation

## 2012-12-03 ENCOUNTER — Emergency Department (HOSPITAL_COMMUNITY)
Admission: EM | Admit: 2012-12-03 | Discharge: 2012-12-03 | Disposition: A | Discharge status: Home / Self Care | Payer: Self-pay | Attending: Emergency Medicine | Admitting: Emergency Medicine

## 2012-12-03 ENCOUNTER — Emergency Department (HOSPITAL_COMMUNITY): Payer: No Typology Code available for payment source

## 2012-12-03 ENCOUNTER — Encounter (HOSPITAL_COMMUNITY): Payer: Self-pay | Admitting: *Deleted

## 2012-12-03 DIAGNOSIS — N12 Tubulo-interstitial nephritis, not specified as acute or chronic: Secondary | ICD-10-CM

## 2012-12-03 LAB — CBC WITH DIFFERENTIAL/PLATELET
Basophils Absolute: 0 10*3/uL (ref 0.0–0.1)
Eosinophils Absolute: 0 10*3/uL (ref 0.0–0.7)
Eosinophils Relative: 0 % (ref 0–5)
Lymphs Abs: 2.8 10*3/uL (ref 0.7–4.0)
MCH: 30.5 pg (ref 26.0–34.0)
Neutrophils Relative %: 65 % (ref 43–77)
Platelets: 170 10*3/uL (ref 150–400)
RBC: 4.46 MIL/uL (ref 3.87–5.11)
RDW: 12.9 % (ref 11.5–15.5)
WBC: 10.3 10*3/uL (ref 4.0–10.5)

## 2012-12-03 LAB — COMPREHENSIVE METABOLIC PANEL
ALT: 11 U/L (ref 0–35)
AST: 18 U/L (ref 0–37)
Albumin: 3.9 g/dL (ref 3.5–5.2)
Alkaline Phosphatase: 40 U/L (ref 39–117)
Calcium: 9.2 mg/dL (ref 8.4–10.5)
GFR calc Af Amer: >90 mL/min (ref >90)
Glucose, Bld: 104 mg/dL — ABNORMAL HIGH (ref 70–99)
Potassium: 4.1 mEq/L (ref 3.5–5.1)
Sodium: 139 mEq/L (ref 135–145)
Total Protein: 7 g/dL (ref 6.0–8.3)

## 2012-12-03 LAB — URINALYSIS, ROUTINE W REFLEX MICROSCOPIC
Nitrite: NEGATIVE
Protein, ur: 30 mg/dL — AB
Specific Gravity, Urine: 1.016 (ref 1.005–1.030)
Urobilinogen, UA: 0.2 mg/dL (ref 0.0–1.0)

## 2012-12-03 LAB — URINE MICROSCOPIC-ADD ON

## 2012-12-03 MED ORDER — SODIUM CHLORIDE 0.9 % IV BOLUS (SEPSIS)
1000.0000 mL | INTRAVENOUS | Status: AC
  Administered 2012-12-03: 1000 mL via INTRAVENOUS

## 2012-12-03 MED ORDER — PHENAZOPYRIDINE HCL 200 MG PO TABS: 200.0000 mg | ORAL_TABLET | Freq: Three times a day (TID) | ORAL | Status: DC | PRN

## 2012-12-03 MED ORDER — PHENAZOPYRIDINE HCL 200 MG PO TABS: 200.0000 mg | ORAL_TABLET | Freq: Once | ORAL | Status: DC

## 2012-12-03 MED ORDER — CIPROFLOXACIN HCL 500 MG PO TABS
500.0000 mg | ORAL_TABLET | Freq: Once | ORAL | Status: AC
  Administered 2012-12-03: 500 mg via ORAL
  Filled 2012-12-03: qty 1

## 2012-12-03 MED ORDER — PHENAZOPYRIDINE HCL 100 MG PO TABS
200.0000 mg | ORAL_TABLET | Freq: Once | ORAL | Status: AC
  Administered 2012-12-03: 200 mg via ORAL
  Filled 2012-12-03: qty 2

## 2012-12-03 MED ORDER — OXYCODONE-ACETAMINOPHEN 5-325 MG PO TABS: 2.0000 | ORAL_TABLET | Freq: Once | ORAL | Status: DC

## 2012-12-03 MED ORDER — HYDROMORPHONE HCL PF 1 MG/ML IJ SOLN
1.0000 mg | Freq: Once | INTRAMUSCULAR | Status: AC
  Administered 2012-12-03: 1 mg via INTRAVENOUS
  Filled 2012-12-03: qty 1

## 2012-12-03 MED ORDER — OXYCODONE-ACETAMINOPHEN 5-325 MG PO TABS: 1.0000 | ORAL_TABLET | Freq: Four times a day (QID) | ORAL | Status: DC | PRN

## 2012-12-03 MED ORDER — OXYCODONE-ACETAMINOPHEN 5-325 MG PO TABS
2.0000 | ORAL_TABLET | Freq: Once | ORAL | Status: AC
  Administered 2012-12-03: 2 via ORAL
  Filled 2012-12-03: qty 2

## 2012-12-03 MED ORDER — CIPROFLOXACIN HCL 500 MG PO TABS: 500.0000 mg | ORAL_TABLET | Freq: Two times a day (BID) | ORAL | Status: DC

## 2012-12-03 MED ORDER — HYDROMORPHONE HCL PF 1 MG/ML IJ SOLN: 1.0000 mg | Freq: Once | INTRAMUSCULAR | Status: DC

## 2012-12-03 MED ORDER — KETOROLAC TROMETHAMINE 30 MG/ML IJ SOLN
30.0000 mg | Freq: Once | INTRAMUSCULAR | Status: AC
  Administered 2012-12-03: 30 mg via INTRAVENOUS
  Filled 2012-12-03: qty 1

## 2012-12-03 NOTE — ED Provider Notes (Signed)
CSN: [id number]     Arrival date & time 12/02/12  2358 History   First MD Initiated Contact with Patient 12/03/12 0032     Chief Complaint  Patient presents with  . Flank Pain   (Consider location/radiation/quality/duration/timing/severity/associated sxs/prior Treatment) HPI Pt is a 27yo female with hx of anxiety, chronic back pain, DDD, bursitis of right and left hip, c/o severe, 9/10 constant, gradually worsening right sided flank pain that started around 2100 this evening.  Pain is dull, worse with movement and palpation.  Also reports urinary frequency and dysuria for 2-3 days.  Denies fever, n/v/d, hematuria, or vaginal symptoms. Does report chronic UTIs as a child. Denies hx of kidney stones.  LNMP: ended 2 days ago.  Past Medical History  Diagnosis Date  . Tachycardia   . Reflux   . Anxiety   . Depression   . Chronic back pain   . Bursitis of right hip   . Bursitis of left hip   . Degenerative disk disease    Past Surgical History  Procedure Laterality Date  . Knee dislocation surgery    . Knee arthroscopy    . Wisdom tooth extraction    . Tonsillectomy    . Adenoidectomy     Family History  Problem Relation Age of Onset  . Diabetes Other   . Hypertension Other    History  Substance Use Topics  . Smoking status: Never Smoker   . Smokeless tobacco: Never Used  . Alcohol Use: No   OB History   Grav Para Term Preterm Abortions TAB SAB Ect Mult Living                 Review of Systems  Constitutional: Negative for fever and chills.  Gastrointestinal: Negative for nausea, vomiting and diarrhea.  Genitourinary: Positive for dysuria, frequency and flank pain ( right). Negative for hematuria, vaginal bleeding, vaginal discharge and vaginal pain.  All other systems reviewed and are negative.    Allergies  Hydrocodone; Metoclopramide hcl; Azithromycin; Diphenhydramine hcl; Ibuprofen; Penicillins; and Tramadol hcl  Home Medications   Current Outpatient Rx  Name   Route  Sig  Dispense  Refill  . acetaminophen (TYLENOL) 500 MG tablet   Oral   Take 1,000 mg by mouth every 6 (six) hours as needed for pain.         . metoprolol succinate (TOPROL-XL) 25 MG 24 hr tablet   Oral   Take 25 mg by mouth every evening.          . Multiple Vitamins-Minerals (ADULT ONE DAILY GUMMIES) CHEW   Oral   Chew 2 each by mouth daily.          BP 118/73  Pulse 100  Temp(Src) 98 F (36.7 C) (Oral)  Resp 24  SpO2 100%  LMP 10/22/2012 Physical Exam  Nursing note and vitals reviewed. Constitutional: She appears well-developed and well-nourished.  Pt lying on right side, curled up, holding right flank.  Appears in pain.  HENT:  Head: Normocephalic and atraumatic.  Eyes: Conjunctivae are normal. No scleral icterus.  Neck: Normal range of motion.  Cardiovascular: Normal rate, regular rhythm and normal heart sounds.   Pulmonary/Chest: Effort normal and breath sounds normal. No respiratory distress. She has no wheezes. She has no rales. She exhibits no tenderness.  Abdominal: Soft. Bowel sounds are normal. She exhibits no distension and no mass. There is no tenderness. There is CVA tenderness ( right). There is no rebound and no guarding.  Musculoskeletal: Normal range of motion. She exhibits tenderness ( right flank). She exhibits no edema.  Neurological: She is alert.  Skin: Skin is warm.    ED Course  Procedures (including critical care time) Labs Review Labs Reviewed  URINALYSIS, ROUTINE W REFLEX MICROSCOPIC - Abnormal; Notable for the following:    APPearance CLOUDY (*)    Hgb urine dipstick LARGE (*)    Protein, ur 30 (*)    Leukocytes, UA LARGE (*)    All other components within normal limits  COMPREHENSIVE METABOLIC PANEL - Abnormal; Notable for the following:    Glucose, Bld 104 (*)    All other components within normal limits  URINE MICROSCOPIC-ADD ON - Abnormal; Notable for the following:    Squamous Epithelial / LPF FEW (*)    Bacteria,  UA MANY (*)    All other components within normal limits  URINE CULTURE  CBC WITH DIFFERENTIAL  POCT PREGNANCY, URINE   Imaging Review Ct Abdomen Pelvis Wo Contrast  12/03/2012   *RADIOLOGY REPORT*  Clinical Data: Right flank pain, dysuria  CT ABDOMEN AND PELVIS WITHOUT CONTRAST  Technique:  Multidetector CT imaging of the abdomen and pelvis was performed following the standard protocol without intravenous contrast.  Comparison: Prior CT abdomen/pelvis 12/21/2009  Findings:  Lower Chest:  Lung bases are clear.  Visualized cardiac structures within normal limits for size.  No pericardial effusion. Unremarkable distal thoracic esophagus.  Abdomen: Unenhanced CT was performed per clinician order.  Lack of IV contrast limits sensitivity and specificity, especially for evaluation of abdominal/pelvic solid viscera.  Unremarkable CT appearance of the stomach, duodenum, spleen, adrenal glands, pancreas and liver Gallbladder is unremarkable. No intra or extrahepatic biliary ductal dilatation.  No hydronephrosis or nephrolithiasis involving either kidney.  Normal-caliber large and small bowel throughout the abdomen. Normal appendix in the right lower quadrant.  No free fluid or suspicious adenopathy.  Pelvis: Unremarkable appearance of the bladder.  Probable 3.4 cm low attenuation cystic structure of fluid with the right adnexa with some layering intermediate attenuation suggesting a possible hemorrhagic ovarian cyst.  Evaluation is limited in the absence of intravenous contrast.  Bones: No acute fracture or aggressive appearing lytic or blastic osseous lesion.  Vascular: No focal vascular abnormality.  IMPRESSION:  1.  Negative for nephrolithiasis or ureterolithiasis.  The appendix is normal.  2.  Prominence of the right ovary with probable mildly complex cystic structure which may represent a hemorrhagic cyst. Evaluation is limited in the absence of intravenous contrast material.  If clinically warranted,  transvaginal ultrasound could further evaluate.   Original Report Authenticated By: Malachy Moan, M.D.    MDM   1. Pyelonephritis    Pt presenting with right flank pain consistent with renal stone or pyelonephritis.  No RLQ or pelvic  Tenderness. Pt reports dysuria but denies vaginal symptoms.  Denies fever, n/v/d.    UA: evidence of UTI.  Will get CT abdomen to r/o renal stone.   Will tx for Pyelonephritis.  Vitals: unremarkable at this time. Pt still in pain.  CT abdomen: negative for nephrolithiasis or ureterolithiasis.  Plan is to manage pt's pain and discharge home.  Signed out to Elsie Stain, NP at shift change.   Junius Finner, PA-C 12/03/12 [phone]

## 2012-12-03 NOTE — ED Notes (Signed)
Pt states burning while urinating for the past couple of days. Pt states right flank pain that started today. Pt states no blood noted in urine. Pt states no problems with bowel.

## 2012-12-03 NOTE — ED Provider Notes (Signed)
Medical screening examination/treatment/procedure(s) were performed by non-physician practitioner and as supervising physician I was immediately available for consultation/collaboration.   Hanley Seamen, MD 12/03/12 [phone]

## 2012-12-03 NOTE — ED Notes (Signed)
Erin, PA at bedside. 

## 2012-12-03 NOTE — ED Notes (Signed)
Pt tearful, laying in bed in fetal position reporting severe pain to right flank area.

## 2012-12-03 NOTE — ED Provider Notes (Signed)
I have started antibiotic, by mouth Cipro, as well as Pyridium and Percocet for patient.  Pain control.  She is feeling, this time.  She is tolerating fluid, I feel she is safe to go home  Arman Filter, NP 12/03/12 [phone]

## 2012-12-03 NOTE — ED Notes (Signed)
Pt states boyfriend is driving her home.

## 2012-12-03 NOTE — ED Notes (Signed)
Pt lying in bed tearful, reporting severe pain right flank area. Earley Favor, notified.

## 2012-12-03 NOTE — ED Notes (Signed)
PA ERIN at bedside.

## 2012-12-04 LAB — URINE CULTURE

## 2012-12-05 ENCOUNTER — Telehealth (HOSPITAL_COMMUNITY): Payer: Self-pay | Admitting: Emergency Medicine

## 2012-12-05 NOTE — ED Notes (Signed)
Post ED Visit - Positive Culture Follow-up  Culture report reviewed by antimicrobial stewardship pharmacist: []  Wes Dulaney, Pharm.D., BCPS [x]  Celedonio Miyamoto, Pharm.D., BCPS []  Georgina Pillion, Pharm.D., BCPS []  [place], [place].D., BCPS, AAHIVP []  Estella Husk, Pharm.D., BCPS, AAHIVP  Positive urine culture Treated with Cipro, organism sensitive to the same and no further patient follow-up is required at this time.  Kylie A Holland 12/05/2012, 11:48 AM

## 2013-01-30 ENCOUNTER — Encounter (HOSPITAL_COMMUNITY): Payer: Self-pay | Admitting: Emergency Medicine

## 2013-01-30 ENCOUNTER — Emergency Department (HOSPITAL_COMMUNITY)
Admission: EM | Admit: 2013-01-30 | Discharge: 2013-01-30 | Disposition: A | Discharge status: Home / Self Care | Payer: No Typology Code available for payment source | Attending: Emergency Medicine | Admitting: Emergency Medicine

## 2013-01-30 DIAGNOSIS — G8929 Other chronic pain: Secondary | ICD-10-CM | POA: Insufficient documentation

## 2013-01-30 DIAGNOSIS — Z880 Allergy status to penicillin: Secondary | ICD-10-CM | POA: Insufficient documentation

## 2013-01-30 DIAGNOSIS — M549 Dorsalgia, unspecified: Secondary | ICD-10-CM

## 2013-01-30 DIAGNOSIS — S39012A Strain of muscle, fascia and tendon of lower back, initial encounter: Secondary | ICD-10-CM

## 2013-01-30 DIAGNOSIS — Y9389 Activity, other specified: Secondary | ICD-10-CM | POA: Insufficient documentation

## 2013-01-30 DIAGNOSIS — Z792 Long term (current) use of antibiotics: Secondary | ICD-10-CM | POA: Insufficient documentation

## 2013-01-30 DIAGNOSIS — Z8659 Personal history of other mental and behavioral disorders: Secondary | ICD-10-CM | POA: Insufficient documentation

## 2013-01-30 DIAGNOSIS — Z8739 Personal history of other diseases of the musculoskeletal system and connective tissue: Secondary | ICD-10-CM | POA: Insufficient documentation

## 2013-01-30 DIAGNOSIS — S335XXA Sprain of ligaments of lumbar spine, initial encounter: Secondary | ICD-10-CM | POA: Insufficient documentation

## 2013-01-30 DIAGNOSIS — Y9289 Other specified places as the place of occurrence of the external cause: Secondary | ICD-10-CM | POA: Insufficient documentation

## 2013-01-30 DIAGNOSIS — Z8719 Personal history of other diseases of the digestive system: Secondary | ICD-10-CM | POA: Insufficient documentation

## 2013-01-30 DIAGNOSIS — X500XXA Overexertion from strenuous movement or load, initial encounter: Secondary | ICD-10-CM | POA: Insufficient documentation

## 2013-01-30 MED ORDER — DIAZEPAM 5 MG PO TABS
5.0000 mg | ORAL_TABLET | Freq: Once | ORAL | Status: AC
  Administered 2013-01-30: 5 mg via ORAL
  Filled 2013-01-30: qty 1

## 2013-01-30 MED ORDER — KETOROLAC TROMETHAMINE 60 MG/2ML IM SOLN
60.0000 mg | Freq: Once | INTRAMUSCULAR | Status: DC
  Filled 2013-01-30: qty 2

## 2013-01-30 NOTE — ED Notes (Signed)
Pt reports 2 days ago she felt something pull in her back while bathing her child. This am she rolled over and felt something pop in back. Pt reports pain to L mid back that radiates into neck. Reports that she lost sensation to L side and fingers started tingling. Pt unable to raise L arm above head or rotate neck. No loss of urine or bowel.

## 2013-01-30 NOTE — ED Provider Notes (Signed)
CSN: [id number]     Arrival date & time 01/30/13  4401 History   First MD Initiated Contact with Patient 01/30/13 [phone]     Chief Complaint  Patient presents with  . Back Pain   (Consider location/radiation/quality/duration/timing/severity/associated sxs/prior Treatment) HPI Comments: Pt is a 27 y/o female with a PMHx of chronic back pain, bursitis of bilateral hips, DJD, anxiety, depression, reflux and tachycardia who presents to the ED complaining of sudden onset left-sided back pain beginning 2 days ago after picking up a young child in the shower. Pain has been worsening over the past 2 days, around 2:00 this morning she woke up with severe pain and felt a "shock" go to the left side of her back, radiating down her left leg and left arm with associated tingling sensation in her fingers and toes. Pain currently 10 out of 10, worse when she moves her left arm, unrelieved by ibuprofen, Tylenol, Percocet. Her boyfriend rubbed Vick's vapor rub on the left side of her back with mild relief. Denies fever, night sweats, loss of control bowels or bladder or saddle anesthesia. Denies neck pain.  Patient is a 27 y.o. female presenting with back pain. The history is provided by the patient.  Back Pain   Past Medical History  Diagnosis Date  . Tachycardia   . Reflux   . Anxiety   . Depression   . Chronic back pain   . Bursitis of right hip   . Bursitis of left hip   . Degenerative disk disease    Past Surgical History  Procedure Laterality Date  . Knee dislocation surgery    . Knee arthroscopy    . Wisdom tooth extraction    . Tonsillectomy    . Adenoidectomy     Family History  Problem Relation Age of Onset  . Diabetes Other   . Hypertension Other    History  Substance Use Topics  . Smoking status: Never Smoker   . Smokeless tobacco: Never Used  . Alcohol Use: No   OB History   Grav Para Term Preterm Abortions TAB SAB Ect Mult Living                 Review of Systems   Musculoskeletal: Positive for back pain.  Neurological:       Positive for tingling sensation in fingers and toes.  All other systems reviewed and are negative.    Allergies  Hydrocodone; Metoclopramide hcl; Azithromycin; Diphenhydramine hcl; Ibuprofen; Penicillins; and Tramadol hcl  Home Medications   Current Outpatient Rx  Name  Route  Sig  Dispense  Refill  . acetaminophen (TYLENOL) 500 MG tablet   Oral   Take 1,000 mg by mouth every 6 (six) hours as needed for pain.         . ciprofloxacin (CIPRO) 500 MG tablet   Oral   Take 1 tablet (500 mg total) by mouth 2 (two) times daily.   19 tablet   0   . metoprolol succinate (TOPROL-XL) 25 MG 24 hr tablet   Oral   Take 25 mg by mouth every evening.          . Multiple Vitamins-Minerals (ADULT ONE DAILY GUMMIES) CHEW   Oral   Chew 2 each by mouth daily.         Marland Kitchen oxyCODONE-acetaminophen (PERCOCET/ROXICET) 5-325 MG per tablet   Oral   Take 1 tablet by mouth every 6 (six) hours as needed for pain.   20 tablet  0   . phenazopyridine (PYRIDIUM) 200 MG tablet   Oral   Take 1 tablet (200 mg total) by mouth 3 (three) times daily as needed for pain.   10 tablet   0    BP 148/69  Pulse 82  Temp(Src) 97.5 F (36.4 C) (Oral)  Resp 20  Ht 5\' 4"  (1.626 m)  Wt 124 lb (56.246 kg)  BMI 21.27 kg/m2  SpO2 100%  LMP 12/29/2012 Physical Exam  Nursing note and vitals reviewed. Constitutional: She is oriented to person, place, and time. She appears well-developed and well-nourished. No distress.  HENT:  Head: Normocephalic and atraumatic.  Mouth/Throat: Oropharynx is clear and moist.  Eyes: Conjunctivae are normal.  Neck: Normal range of motion. Neck supple.  Cardiovascular: Normal rate, regular rhythm and normal heart sounds.   Pulmonary/Chest: Effort normal and breath sounds normal.  Musculoskeletal: Normal range of motion. She exhibits no edema.  TTP of left parathoracic and paralumbar muscles. No spinous process  tenderness. No c-spine or cervical muscular tenderness.  Neurological: She is alert and oriented to person, place, and time. She has normal reflexes.  Strength upper and lower extremities 5/5 and equal bilateral. Sensation intact.  Skin: Skin is warm and dry. She is not diaphoretic.  Psychiatric: She has a normal mood and affect. Her behavior is normal.    ED Course  Procedures (including critical care time) Labs Review Labs Reviewed - No data to display Imaging Review No results found.  EKG Interpretation   None       MDM   1. Back pain   2. Lumbar strain, initial encounter    Patient presenting with back pain after a mechanical injury. No red flags concerning patient's back pain. No s/s of central cord compression or cauda equina. Lower extremities are neurovascularly intact and patient is ambulating without difficulty. Valium given in the emergency department which patient states she did not have any relief, Toradol was ordered, however patient refused. States she had this in the past and it does not work for her, also does not like injections, states "even if you gave me an injection of are narcotic or something stronger, I would refuse". I then offered naproxen, however patient states "I can take Aleve at home, I do not need you to give me that here". She states she is comfortable going home. Stable for discharge.     Trevor Mace, PA-C 01/30/13 [phone]

## 2013-02-02 NOTE — ED Provider Notes (Signed)
Medical screening examination/treatment/procedure(s) were performed by non-physician practitioner and as supervising physician I was immediately available for consultation/collaboration.   Azaliah Carrero, MD 02/02/13 0559 

## 2014-03-20 ENCOUNTER — Emergency Department (HOSPITAL_COMMUNITY)
Admission: EM | Admit: 2014-03-20 | Discharge: 2014-03-20 | Disposition: A | Discharge status: Home / Self Care | Payer: No Typology Code available for payment source | Attending: Emergency Medicine | Admitting: Emergency Medicine

## 2014-03-20 ENCOUNTER — Encounter (HOSPITAL_COMMUNITY): Payer: Self-pay | Admitting: Emergency Medicine

## 2014-03-20 DIAGNOSIS — Z880 Allergy status to penicillin: Secondary | ICD-10-CM | POA: Insufficient documentation

## 2014-03-20 DIAGNOSIS — Z8659 Personal history of other mental and behavioral disorders: Secondary | ICD-10-CM | POA: Insufficient documentation

## 2014-03-20 DIAGNOSIS — M79602 Pain in left arm: Secondary | ICD-10-CM | POA: Insufficient documentation

## 2014-03-20 DIAGNOSIS — M79652 Pain in left thigh: Secondary | ICD-10-CM | POA: Insufficient documentation

## 2014-03-20 DIAGNOSIS — G8929 Other chronic pain: Secondary | ICD-10-CM | POA: Insufficient documentation

## 2014-03-20 DIAGNOSIS — Z79899 Other long term (current) drug therapy: Secondary | ICD-10-CM | POA: Insufficient documentation

## 2014-03-20 DIAGNOSIS — Z760 Encounter for issue of repeat prescription: Secondary | ICD-10-CM | POA: Insufficient documentation

## 2014-03-20 DIAGNOSIS — M79651 Pain in right thigh: Secondary | ICD-10-CM | POA: Insufficient documentation

## 2014-03-20 MED ORDER — OXYCODONE-ACETAMINOPHEN 5-325 MG PO TABS: 2.0000 | ORAL_TABLET | ORAL | Status: DC | PRN

## 2014-03-20 MED ORDER — OXYCODONE-ACETAMINOPHEN 5-325 MG PO TABS
1.0000 | ORAL_TABLET | Freq: Once | ORAL | Status: AC
  Administered 2014-03-20: 1 via ORAL
  Filled 2014-03-20: qty 1

## 2014-03-20 MED ORDER — CYCLOBENZAPRINE HCL 10 MG PO TABS: 10.0000 mg | ORAL_TABLET | Freq: Two times a day (BID) | ORAL | Status: DC | PRN

## 2014-03-20 NOTE — ED Provider Notes (Signed)
CSN: [id number]     Arrival date & time 03/20/14  2031 History   First MD Initiated Contact with Patient 03/20/14 2118     Chief Complaint  Patient presents with  . Leg Pain     (Consider location/radiation/quality/duration/timing/severity/associated sxs/prior Treatment) HPI Adriana Galvan is a 29 y.o. female who presents to the ED via EMS with fibromyalgia pain. She states she is out of her pain medication and has been hurting for the past 4 days. The pain is in the legs and hips and left arm. She states that she has a regular doctor that treats her pain but she did not get to go this month and so she does not have her medication. She came tonight by EMS and does not have a way home.   Past Medical History  Diagnosis Date  . Tachycardia   . Reflux   . Anxiety   . Depression   . Chronic back pain   . Bursitis of right hip   . Bursitis of left hip   . Degenerative disk disease    Past Surgical History  Procedure Laterality Date  . Knee dislocation surgery    . Knee arthroscopy    . Wisdom tooth extraction    . Tonsillectomy    . Adenoidectomy     Family History  Problem Relation Age of Onset  . Diabetes Other   . Hypertension Other    History  Substance Use Topics  . Smoking status: Never Smoker   . Smokeless tobacco: Never Used  . Alcohol Use: No   OB History    No data available     Review of Systems Negative except as stated in HPI   Allergies  Hydrocodone; Metoclopramide hcl; Azithromycin; Diphenhydramine hcl; Ibuprofen; Penicillins; and Tramadol hcl  Home Medications   Prior to Admission medications   Medication Sig Start Date End Date Taking? Authorizing Provider  Melatonin 5 MG TABS Take 5 mg by mouth at bedtime.   Yes Historical Provider, MD  cyclobenzaprine (FLEXERIL) 10 MG tablet Take 1 tablet (10 mg total) by mouth 2 (two) times daily as needed for muscle spasms. 03/20/14   Hope Orlene Och, NP  oxyCODONE-acetaminophen (PERCOCET/ROXICET) 5-325 MG per  tablet Take 2 tablets by mouth every 4 (four) hours as needed for moderate pain or severe pain. 03/20/14   Hope Orlene Och, NP   BP 126/80 mmHg  Pulse 91  Temp(Src) 98.4 F (36.9 C) (Oral)  Resp 16  Ht  (1.626 m)  Wt 125 lb (56.7 kg)  BMI 21.45 kg/m2  SpO2 100%  LMP 02/20/2014 Physical Exam  Constitutional: She is oriented to person, place, and time. She appears well-developed and well-nourished. No distress.  HENT:  Head: Normocephalic.  Eyes: EOM are normal.  Neck: Normal range of motion. Neck supple.  Cardiovascular: Normal rate and regular rhythm.   Pulmonary/Chest: Effort normal. No respiratory distress. She has no wheezes. She has no rales.  Abdominal: Soft. There is no tenderness.  Musculoskeletal: Normal range of motion.  Pain bilateral thighs.  Neurological: She is alert and oriented to person, place, and time. No cranial nerve deficit.  Skin: Skin is warm and dry.  Psychiatric: She has a normal mood and affect. Her behavior is normal.  Nursing note and vitals reviewed.   ED Course  Procedures (including critical care time) Labs Review Percocet 5/325 mg. Given in ED patient with good results. Decreased pain, ambulating without difficulty. MDM  29 y.o. female with chronic  pain here for medication refill. Discussed with patient chronic pain and need for management by PCP. Will give pre pack for pain tonight and she will call her PCP tomorrow.   Final diagnoses:  Chronic pain      Janne NapoleonHope M Neese, NP 03/20/14 2250  Raeford RazorStephen Kohut, MD 03/21/14 [phone]

## 2014-03-20 NOTE — ED Notes (Signed)
Pt says she has been hurting for 4 days. Out of her meds. Being treated for fibromyalgia. Came in via EMS  Pain in legs,lt arm

## 2014-03-20 NOTE — ED Notes (Signed)
Pt c/o bilateral leg and hips pain. Pt states she is out of pain meds.

## 2014-03-25 MED FILL — Oxycodone w/ Acetaminophen Tab 5-325 MG: ORAL | Qty: 6 | Status: AC

## 2014-05-25 ENCOUNTER — Emergency Department (HOSPITAL_COMMUNITY)
Admission: EM | Admit: 2014-05-25 | Discharge: 2014-05-25 | Disposition: A | Discharge status: Home / Self Care | Payer: Self-pay | Attending: Emergency Medicine | Admitting: Emergency Medicine

## 2014-05-25 ENCOUNTER — Encounter (HOSPITAL_COMMUNITY): Payer: Self-pay

## 2014-05-25 DIAGNOSIS — Z8659 Personal history of other mental and behavioral disorders: Secondary | ICD-10-CM | POA: Insufficient documentation

## 2014-05-25 DIAGNOSIS — M79606 Pain in leg, unspecified: Secondary | ICD-10-CM

## 2014-05-25 DIAGNOSIS — Z79899 Other long term (current) drug therapy: Secondary | ICD-10-CM | POA: Insufficient documentation

## 2014-05-25 DIAGNOSIS — Z8719 Personal history of other diseases of the digestive system: Secondary | ICD-10-CM | POA: Insufficient documentation

## 2014-05-25 DIAGNOSIS — M79605 Pain in left leg: Secondary | ICD-10-CM | POA: Insufficient documentation

## 2014-05-25 DIAGNOSIS — M797 Fibromyalgia: Secondary | ICD-10-CM | POA: Insufficient documentation

## 2014-05-25 DIAGNOSIS — G8929 Other chronic pain: Secondary | ICD-10-CM | POA: Insufficient documentation

## 2014-05-25 DIAGNOSIS — Z880 Allergy status to penicillin: Secondary | ICD-10-CM | POA: Insufficient documentation

## 2014-05-25 HISTORY — DX: Fibromyalgia: M79.7

## 2014-05-25 MED ORDER — OXYCODONE-ACETAMINOPHEN 5-325 MG PO TABS
2.0000 | ORAL_TABLET | Freq: Once | ORAL | Status: AC
  Administered 2014-05-25: 2 via ORAL
  Filled 2014-05-25: qty 2

## 2014-05-25 MED ORDER — OXYCODONE-ACETAMINOPHEN 5-325 MG PO TABS: 1.0000 | ORAL_TABLET | ORAL | Status: DC | PRN

## 2014-05-25 NOTE — ED Notes (Signed)
Pt verbalized understanding of no driving and to use caution within 4 hours of taking pain meds due to meds cause drowsiness 

## 2014-05-25 NOTE — ED Notes (Signed)
Patient states that she has a history of fibromyalgia and that she has been having a lot of pain in her extremities and hips that has been worse since the change in the weather. Patient also states that she has been hurting all over but more in her legs and hips.

## 2014-05-25 NOTE — ED Provider Notes (Signed)
TIME SEEN: 10:45 PM  CHIEF COMPLAINT: Chronic leg pain  HPI: Pt is a 29 y.o. female with history of chronic back pain, fibromyalgia with chronic lower extremity pain who presents emergency department with complaints of increasing lower extremity pain for the past several days. States that her fibromyalgia usually gets worse with changes in weather. States she does see her primary care physician who prescribes her Percocet 10 mg but states she is out of this medication. She is not asking for prescription to take home but is requesting Percocet in the emergency department. Denies any new injury. No fever. Reports she does have some numbness to the left posterior shoulder blade, left anterior thigh and left foot that is chronic. No new numbness or focal weakness. No new back pain. No bowel or bladder incontinence.  ROS: See HPI Constitutional: no fever  Eyes: no drainage  ENT: no runny nose   Cardiovascular:  no chest pain  Resp: no SOB  GI: no vomiting GU: no dysuria Integumentary: no rash  Allergy: no hives  Musculoskeletal: no leg swelling  Neurological: no slurred speech ROS otherwise negative  PAST MEDICAL HISTORY/PAST SURGICAL HISTORY:  Past Medical History  Diagnosis Date  . Tachycardia   . Reflux   . Anxiety   . Depression   . Chronic back pain   . Bursitis of right hip   . Bursitis of left hip   . Degenerative disk disease   . Fibromyalgia     MEDICATIONS:  Prior to Admission medications   Medication Sig Start Date End Date Taking? Authorizing Provider  cyclobenzaprine (FLEXERIL) 10 MG tablet Take 1 tablet (10 mg total) by mouth 2 (two) times daily as needed for muscle spasms. 03/20/14  Yes Hope Orlene Och, NP  Melatonin 5 MG TABS Take 5 mg by mouth at bedtime.   Yes Historical Provider, MD  oxyCODONE-acetaminophen (PERCOCET/ROXICET) 5-325 MG per tablet Take 2 tablets by mouth every 4 (four) hours as needed for moderate pain or severe pain. 03/20/14  Yes Hope Orlene Och, NP   oxyCODONE-acetaminophen (PERCOCET/ROXICET) 5-325 MG per tablet Take 1 tablet by mouth every 4 (four) hours as needed. 05/25/14   Kristen N Ward, DO    ALLERGIES:  Allergies  Allergen Reactions  . Hydrocodone Nausea And Vomiting and Palpitations  . Metoclopramide Hcl Other (See Comments)    REACTION: dyskinesia  . Azithromycin Hives and Swelling    Lip swelling  . Diphenhydramine Hcl Hives  . Ibuprofen Swelling  . Penicillins Hives and Swelling  . Tramadol Hcl Hives and Swelling    Lip swelling    SOCIAL HISTORY:  History  Substance Use Topics  . Smoking status: Never Smoker   . Smokeless tobacco: Never Used  . Alcohol Use: No    FAMILY HISTORY: Family History  Problem Relation Age of Onset  . Diabetes Other   . Hypertension Other     EXAM: BP 136/83 mmHg  Pulse 78  Temp(Src) 98.4 F (36.9 C) (Oral)  Resp 16  Ht  (1.626 m)  Wt 120 lb (54.432 kg)  BMI 20.59 kg/m2  SpO2 100%  LMP 05/18/2014 (Approximate) CONSTITUTIONAL: Alert and oriented and responds appropriately to questions. Well-appearing; well-nourished, appears uncomfortable but nontoxic HEAD: Normocephalic EYES: Conjunctivae clear, PERRL ENT: normal nose; no rhinorrhea; moist mucous membranes; pharynx without lesions noted NECK: Supple, no meningismus, no LAD  CARD: RRR; S1 and S2 appreciated; no murmurs, no clicks, no rubs, no gallops RESP: Normal chest excursion without splinting or tachypnea;  breath sounds clear and equal bilaterally; no wheezes, no rhonchi, no rales,  ABD/GI: Normal bowel sounds; non-distended; soft, non-tender, no rebound, no guarding BACK:  The back appears normal and is non-tender to palpation, there is no CVA tenderness EXT: Reports decreased sensation over the left anterior thigh and left foot which is chronic, otherwise sensation to light touch intact diffusely, Normal ROM in all joints; non-tender to palpation; no edema; normal capillary refill; no cyanosis; compartments  soft, no joint effusion, no erythema or warmth, no rash, no bony tenderness, no calf tenderness or swelling; 2+ DP pulses bilaterally   SKIN: Normal color for age and race; warm NEURO: Moves all extremities equally, normal sensation diffusely except over the left anterior thigh and foot which is chronic for patient, normal gait PSYCH: The patient's mood and manner are appropriate. Grooming and personal hygiene are appropriate.  MEDICAL DECISION MAKING: Patient here with exacerbation of her chronic pain. We'll provide Percocet in the emergency department and 6 tablets to take home overnight. She states she has a follow-up appointment with her primary care physician this week. No sign of infection on exam. No new neurologic deficits. I do not feel she needs imaging at this time. Discussed return precautions. She verbalized understanding and is comfortable with plan.      Layla MawKristen N Ward, DO 05/25/14 2253

## 2014-05-25 NOTE — Discharge Instructions (Signed)
Chronic Pain Chronic pain can be defined as pain that is off and on and lasts for 3-6 months or longer. Many things cause chronic pain, which can make it difficult to make a diagnosis. There are many treatment options available for chronic pain. However, finding a treatment that works well for you may require trying various approaches until the right one is found. Many people benefit from a combination of two or more types of treatment to control their pain. SYMPTOMS  Chronic pain can occur anywhere in the body and can range from mild to very severe. Some types of chronic pain include:  Headache.  Low back pain.  Cancer pain.  Arthritis pain.  Neurogenic pain. This is pain resulting from damage to nerves. People with chronic pain may also have other symptoms such as:  Depression.  Anger.  Insomnia.  Anxiety. DIAGNOSIS  Your health care provider will help diagnose your condition over time. In many cases, the initial focus will be on excluding possible conditions that could be causing the pain. Depending on your symptoms, your health care provider may order tests to diagnose your condition. Some of these tests may include:   Blood tests.   CT scan.   MRI.   X-rays.   Ultrasounds.   Nerve conduction studies.  You may need to see a specialist.  TREATMENT  Finding treatment that works well may take time. You may be referred to a pain specialist. He or she may prescribe medicine or therapies, such as:   Mindful meditation or yoga.  Shots (injections) of numbing or pain-relieving medicines into the spine or area of pain.  Local electrical stimulation.  Acupuncture.   Massage therapy.   Aroma, color, light, or sound therapy.   Biofeedback.   Working with a physical therapist to keep from getting stiff.   Regular, gentle exercise.   Cognitive or behavioral therapy.   Group support.  Sometimes, surgery may be recommended.  HOME CARE INSTRUCTIONS    Take all medicines as directed by your health care provider.   Lessen stress in your life by relaxing and doing things such as listening to calming music.   Exercise or be active as directed by your health care provider.   Eat a healthy diet and include things such as vegetables, fruits, fish, and lean meats in your diet.   Keep all follow-up appointments with your health care provider.   Attend a support group with others suffering from chronic pain. SEEK MEDICAL CARE IF:   Your pain gets worse.   You develop a new pain that was not there before.   You cannot tolerate medicines given to you by your health care provider.   You have new symptoms since your last visit with your health care provider.  SEEK IMMEDIATE MEDICAL CARE IF:   You feel weak.   You have decreased sensation or numbness.   You lose control of bowel or bladder function.   Your pain suddenly gets much worse.   You develop shaking.  You develop chills.  You develop confusion.  You develop chest pain.  You develop shortness of breath.  MAKE SURE YOU:  Understand these instructions.  Will watch your condition.  Will get help right away if you are not doing well or get worse. Document Released: 11/06/2001 Document Revised: 10/17/2012 Document Reviewed: 08/10/2012 ExitCare Patient Information 2015 [employer]. This information is not intended to replace advice given to you by your health care provider. Make sure you discuss any   questions you have with your health care provider. Fibromyalgia Fibromyalgia is a disorder that is often misunderstood. It is associated with muscular pains and tenderness that comes and goes. It is often associated with fatigue and sleep disturbances. Though it tends to be long-lasting, fibromyalgia is not life-threatening. CAUSES  The exact cause of fibromyalgia is unknown. People with certain gene types are predisposed to developing fibromyalgia and other  conditions. Certain factors can play a role as triggers, such as:  Spine disorders.  Arthritis.  Severe injury (trauma) and other physical stressors.  Emotional stressors. SYMPTOMS   The main symptom is pain and stiffness in the muscles and joints, which can vary over time.  Sleep and fatigue problems. Other related symptoms may include:  Bowel and bladder problems.  Headaches.  Visual problems.  Problems with odors and noises.  Depression or mood changes.  Painful periods (dysmenorrhea).  Dryness of the skin or eyes. DIAGNOSIS  There are no specific tests for diagnosing fibromyalgia. Patients can be diagnosed accurately from the specific symptoms they have. The diagnosis is made by determining that nothing else is causing the problems. TREATMENT  There is no cure. Management includes medicines and an active, healthy lifestyle. The goal is to enhance physical fitness, decrease pain, and improve sleep. HOME CARE INSTRUCTIONS   Only take over-the-counter or prescription medicines as directed by your caregiver. Sleeping pills, tranquilizers, and pain medicines may make your problems worse.  Low-impact aerobic exercise is very important and advised for treatment. At first, it may seem to make pain worse. Gradually increasing your tolerance will overcome this feeling.  Learning relaxation techniques and how to control stress will help you. Biofeedback, visual imagery, hypnosis, muscle relaxation, yoga, and meditation are all options.  Anti-inflammatory medicines and physical therapy may provide short-term help.  Acupuncture or massage treatments may help.  Take muscle relaxant medicines as suggested by your caregiver.  Avoid stressful situations.  Plan a healthy lifestyle. This includes your diet, sleep, rest, exercise, and friends.  Find and practice a hobby you enjoy.  Join a fibromyalgia support group for interaction, ideas, and sharing advice. This may be  helpful. SEEK MEDICAL CARE IF:  You are not having good results or improvement from your treatment. FOR MORE INFORMATION  National Fibromyalgia Association: www.fmaware.org Arthritis Foundation: www.arthritis.org Document Released: 02/14/2005 Document Revised: 05/09/2011 Document Reviewed: 05/27/2009 ExitCare Patient Information 2015 [employer]. This information is not intended to replace advice given to you by your health care provider. Make sure you discuss any questions you have with your health care provider.  

## 2014-06-02 MED FILL — Oxycodone w/ Acetaminophen Tab 5-325 MG: ORAL | Qty: 6 | Status: AC

## 2014-07-02 ENCOUNTER — Encounter (HOSPITAL_COMMUNITY): Payer: Self-pay | Admitting: Cardiology

## 2014-07-02 ENCOUNTER — Emergency Department (HOSPITAL_COMMUNITY)
Admission: EM | Admit: 2014-07-02 | Discharge: 2014-07-02 | Disposition: A | Discharge status: Home / Self Care | Payer: Self-pay | Attending: Emergency Medicine | Admitting: Emergency Medicine

## 2014-07-02 DIAGNOSIS — Z8719 Personal history of other diseases of the digestive system: Secondary | ICD-10-CM | POA: Insufficient documentation

## 2014-07-02 DIAGNOSIS — G8929 Other chronic pain: Secondary | ICD-10-CM | POA: Insufficient documentation

## 2014-07-02 DIAGNOSIS — Z8659 Personal history of other mental and behavioral disorders: Secondary | ICD-10-CM | POA: Insufficient documentation

## 2014-07-02 DIAGNOSIS — Z880 Allergy status to penicillin: Secondary | ICD-10-CM | POA: Insufficient documentation

## 2014-07-02 DIAGNOSIS — M797 Fibromyalgia: Secondary | ICD-10-CM | POA: Insufficient documentation

## 2014-07-02 DIAGNOSIS — B354 Tinea corporis: Secondary | ICD-10-CM | POA: Insufficient documentation

## 2014-07-02 MED ORDER — FLUCONAZOLE 200 MG PO TABS: ORAL_TABLET | ORAL | Status: DC

## 2014-07-02 NOTE — Discharge Instructions (Signed)

## 2014-07-02 NOTE — ED Provider Notes (Signed)
CSN: [id number]     Arrival date & time 07/02/14  1123 History   First MD Initiated Contact with Patient 07/02/14 1145     No chief complaint on file.    (Consider location/radiation/quality/duration/timing/severity/associated sxs/prior Treatment) Patient is a 29 y.o. female presenting with rash. The history is provided by the patient.  Rash Adriana Galvan is a 29 y.o. female who presents to the ED with a rash. The rash started about a month ago and seems to be getting worse. The rash is located on the inner aspect of the thighs and a few places on the abdomen. There areas are dry and itch. She states she looked on goggle and thinks she has ringworm. She has not tried anything on the areas.   Past Medical History  Diagnosis Date  . Tachycardia   . Reflux   . Anxiety   . Depression   . Chronic back pain   . Bursitis of right hip   . Bursitis of left hip   . Degenerative disk disease   . Fibromyalgia    Past Surgical History  Procedure Laterality Date  . Knee dislocation surgery    . Knee arthroscopy    . Wisdom tooth extraction    . Tonsillectomy    . Adenoidectomy     Family History  Problem Relation Age of Onset  . Diabetes Other   . Hypertension Other    History  Substance Use Topics  . Smoking status: Never Smoker   . Smokeless tobacco: Never Used  . Alcohol Use: No   OB History    Gravida Para Term Preterm AB TAB SAB Ectopic Multiple Living   0 0 0 0 0 0 0 0 0 0      Review of Systems  Skin: Positive for rash.  negative except as stated in HPI    Allergies  Hydrocodone; Metoclopramide hcl; Azithromycin; Diphenhydramine hcl; Ibuprofen; Penicillins; and Tramadol hcl  Home Medications   Prior to Admission medications   Medication Sig Start Date End Date Taking? Authorizing Provider  cyclobenzaprine (FLEXERIL) 10 MG tablet Take 1 tablet (10 mg total) by mouth 2 (two) times daily as needed for muscle spasms. 03/20/14   Nyela Cortinas Orlene OchM Brunella Wileman, NP  fluconazole (DIFLUCAN)  200 MG tablet Take one tablet PO once a week 07/02/14   Janne NapoleonHope M Zandria Woldt, NP  Melatonin 5 MG TABS Take 5 mg by mouth at bedtime.    Historical Provider, MD  oxyCODONE-acetaminophen (PERCOCET/ROXICET) 5-325 MG per tablet Take 2 tablets by mouth every 4 (four) hours as needed for moderate pain or severe pain. 03/20/14   Rumaisa Schnetzer Orlene OchM Cannon Arreola, NP  oxyCODONE-acetaminophen (PERCOCET/ROXICET) 5-325 MG per tablet Take 1 tablet by mouth every 4 (four) hours as needed. 05/25/14   Kristen N Ward, DO   BP 113/70 mmHg  Pulse 74  Temp(Src) 97.9 F (36.6 C) (Oral)  Resp 16  Ht 5\' 4"  (1.626 m)  Wt 122 lb (55.339 kg)  BMI 20.93 kg/m2  SpO2 100%  LMP 06/25/2014 Physical Exam  Constitutional: She is oriented to person, place, and time. She appears well-developed and well-nourished. No distress.  HENT:  Head: Normocephalic and atraumatic.  Eyes: EOM are normal.  Neck: Neck supple.  Cardiovascular: Normal rate.   Pulmonary/Chest: Effort normal.  Abdominal: There is no tenderness.  Musculoskeletal: Normal range of motion.  Neurological: She is alert and oriented to person, place, and time. No cranial nerve deficit.  Skin: Skin is warm and dry. Rash noted.  Circular  areas with clear centers approximately 2 cms to bilateral thighs.   Psychiatric: She has a normal mood and affect. Her behavior is normal.  Nursing note and vitals reviewed.   ED Course  Procedures (including critical care time) Labs Review  MDM  29 y.o. female with rash and itching. Will treat for ringworm and she will follow up with her PCP or return for worsening symptoms. Discussed with the patient and all questioned fully answered.  Final diagnoses:  Tinea corporis       Janne NapoleonHope M Lenis Nettleton, NP 07/02/14 1646  Raeford RazorStephen Kohut, MD 07/02/14 [phone]

## 2014-07-02 NOTE — ED Notes (Signed)
C/o rash times one month.

## 2014-07-27 ENCOUNTER — Emergency Department (HOSPITAL_COMMUNITY)
Admission: EM | Admit: 2014-07-27 | Discharge: 2014-07-27 | Disposition: A | Discharge status: Home / Self Care | Payer: Self-pay | Attending: Emergency Medicine | Admitting: Emergency Medicine

## 2014-07-27 ENCOUNTER — Encounter (HOSPITAL_COMMUNITY): Payer: Self-pay

## 2014-07-27 DIAGNOSIS — M62830 Muscle spasm of back: Secondary | ICD-10-CM | POA: Insufficient documentation

## 2014-07-27 DIAGNOSIS — Z8659 Personal history of other mental and behavioral disorders: Secondary | ICD-10-CM | POA: Insufficient documentation

## 2014-07-27 DIAGNOSIS — Z880 Allergy status to penicillin: Secondary | ICD-10-CM | POA: Insufficient documentation

## 2014-07-27 DIAGNOSIS — Z8719 Personal history of other diseases of the digestive system: Secondary | ICD-10-CM | POA: Insufficient documentation

## 2014-07-27 DIAGNOSIS — G8929 Other chronic pain: Secondary | ICD-10-CM | POA: Insufficient documentation

## 2014-07-27 MED ORDER — METHOCARBAMOL 500 MG PO TABS
500.0000 mg | ORAL_TABLET | Freq: Once | ORAL | Status: AC
  Administered 2014-07-27: 500 mg via ORAL
  Filled 2014-07-27: qty 1

## 2014-07-27 MED ORDER — METHOCARBAMOL 500 MG PO TABS: 500.0000 mg | ORAL_TABLET | Freq: Two times a day (BID) | ORAL | Status: DC

## 2014-07-27 NOTE — ED Notes (Signed)
I am having muscle spasms in my neck and upper back. I have a history of fibromyalgia per pt. I have random spasms and pain all the time.

## 2014-07-27 NOTE — Discharge Instructions (Signed)
Take the medication as directed. Do not drive if taking the medication as it will make you sleepy.

## 2014-07-27 NOTE — ED Provider Notes (Signed)
CSN: [id number]     Arrival date & time 07/27/14  1942 History   First MD Initiated Contact with Patient 07/27/14 1953     Chief Complaint  Patient presents with  . Spasms     (Consider location/radiation/quality/duration/timing/severity/associated sxs/prior Treatment) HPI Adriana Galvan is a 29 y.o. female who presents to the ED with pain to the upper back and neck that she thinks is muscle spasm. Patient with hx of fibromyalgia and states she has random spasms and pain all the time.   Past Medical History  Diagnosis Date  . Tachycardia   . Reflux   . Anxiety   . Depression   . Chronic back pain   . Bursitis of right hip   . Bursitis of left hip   . Degenerative disk disease   . Fibromyalgia    Past Surgical History  Procedure Laterality Date  . Knee dislocation surgery    . Knee arthroscopy    . Wisdom tooth extraction    . Tonsillectomy    . Adenoidectomy     Family History  Problem Relation Age of Onset  . Diabetes Other   . Hypertension Other    History  Substance Use Topics  . Smoking status: Never Smoker   . Smokeless tobacco: Never Used  . Alcohol Use: No   OB History    Gravida Para Term Preterm AB TAB SAB Ectopic Multiple Living   0 0 0 0 0 0 0 0 0 0      Review of Systems Negative except as stated in HPI   Allergies  Hydrocodone; Metoclopramide hcl; Azithromycin; Diphenhydramine hcl; Ibuprofen; Penicillins; and Tramadol hcl  Home Medications   Prior to Admission medications   Medication Sig Start Date End Date Taking? Authorizing Provider  Melatonin 5 MG TABS Take 5 mg by mouth at bedtime.    Historical Provider, MD  methocarbamol (ROBAXIN) 500 MG tablet Take 1 tablet (500 mg total) by mouth 2 (two) times daily. 07/27/14   Sholonda Jobst Orlene OchM Sibley Rolison, NP   BP 143/93 mmHg  Pulse 68  Temp(Src) 97.9 F (36.6 C) (Oral)  Resp 22  SpO2 96%  LMP 07/20/2014 (Approximate) Physical Exam  Constitutional: She is oriented to person, place, and time. She appears  well-developed and well-nourished.  Eyes: EOM are normal.  Neck: Neck supple.  Cardiovascular: Normal rate.   Pulmonary/Chest: Effort normal.  Musculoskeletal: Normal range of motion.       Cervical back: She exhibits tenderness and spasm. She exhibits normal range of motion.  Neurological: She is alert and oriented to person, place, and time. No cranial nerve deficit.  Skin: Skin is warm and dry.  Psychiatric: She has a normal mood and affect. Her behavior is normal.  Nursing note and vitals reviewed.   ED Course  Procedures   MDM  29 y.o. female with muscle spasm of the upper back and hx of fibromyalgia. Stable for discharge without focal neuro deficits. She has a follow up appointment with her PCP this week. She will return here if problems arise.   Final diagnoses:  Muscle spasm of back      [facility] M Temekia Caskey, NP 07/27/14 2021  Raeford RazorStephen Kohut, MD 07/29/14 [phone]

## 2014-07-29 DIAGNOSIS — Z880 Allergy status to penicillin: Secondary | ICD-10-CM | POA: Insufficient documentation

## 2014-07-29 DIAGNOSIS — Z8739 Personal history of other diseases of the musculoskeletal system and connective tissue: Secondary | ICD-10-CM | POA: Insufficient documentation

## 2014-07-29 DIAGNOSIS — Z8719 Personal history of other diseases of the digestive system: Secondary | ICD-10-CM | POA: Insufficient documentation

## 2014-07-29 DIAGNOSIS — Z79899 Other long term (current) drug therapy: Secondary | ICD-10-CM | POA: Insufficient documentation

## 2014-07-29 DIAGNOSIS — G8929 Other chronic pain: Secondary | ICD-10-CM | POA: Insufficient documentation

## 2014-07-29 DIAGNOSIS — Z8659 Personal history of other mental and behavioral disorders: Secondary | ICD-10-CM | POA: Insufficient documentation

## 2014-07-29 DIAGNOSIS — J069 Acute upper respiratory infection, unspecified: Secondary | ICD-10-CM | POA: Insufficient documentation

## 2014-07-30 ENCOUNTER — Encounter (HOSPITAL_COMMUNITY): Payer: Self-pay | Admitting: Emergency Medicine

## 2014-07-30 ENCOUNTER — Emergency Department (HOSPITAL_COMMUNITY)
Admission: EM | Admit: 2014-07-30 | Discharge: 2014-07-30 | Disposition: A | Discharge status: Home / Self Care | Payer: Self-pay | Attending: Emergency Medicine | Admitting: Emergency Medicine

## 2014-07-30 DIAGNOSIS — J069 Acute upper respiratory infection, unspecified: Secondary | ICD-10-CM

## 2014-07-30 LAB — RAPID STREP SCREEN (MED CTR MEBANE ONLY): STREPTOCOCCUS, GROUP A SCREEN (DIRECT): NEGATIVE

## 2014-07-30 MED ORDER — ACETAMINOPHEN 500 MG PO TABS
1000.0000 mg | ORAL_TABLET | Freq: Once | ORAL | Status: AC
  Administered 2014-07-30: 1000 mg via ORAL
  Filled 2014-07-30: qty 2

## 2014-07-30 NOTE — ED Provider Notes (Signed)
TIME SEEN: 2:30 AM  CHIEF COMPLAINT: Sore throat, ear pain, dry cough  HPI: Pt is a 29 y.o. female with history of fibromyalgia, chronic pain who presents to the emergency Department with complaints of 2 days of sore throat, bilateral ear pain, dry cough. No fever. No vomiting or diarrhea. No sick contacts or recent travel.  Was also seen in emergency room on 5/29 for spasms that she states are secondary to her fibromyalgia in her neck and back. Reports she was discharged on Robaxin and is requesting diazepam instead for her pain. No injury to the neck. No numbness, tearing or focal weakness. No bowel or bladder incontinence.  ROS: See HPI Constitutional: no fever  Eyes: no drainage  ENT: no runny nose   Cardiovascular:  no chest pain  Resp: no SOB  GI: no vomiting GU: no dysuria Integumentary: no rash  Allergy: no hives  Musculoskeletal: no leg swelling  Neurological: no slurred speech ROS otherwise negative  PAST MEDICAL HISTORY/PAST SURGICAL HISTORY:  Past Medical History  Diagnosis Date  . Tachycardia   . Reflux   . Anxiety   . Depression   . Chronic back pain   . Bursitis of right hip   . Bursitis of left hip   . Degenerative disk disease   . Fibromyalgia     MEDICATIONS:  Prior to Admission medications   Medication Sig Start Date End Date Taking? Authorizing Provider  Melatonin 5 MG TABS Take 5 mg by mouth at bedtime.    Historical Provider, MD  methocarbamol (ROBAXIN) 500 MG tablet Take 1 tablet (500 mg total) by mouth 2 (two) times daily. 07/27/14   Hope Orlene OchM Neese, NP    ALLERGIES:  Allergies  Allergen Reactions  . Hydrocodone Nausea And Vomiting and Palpitations  . Metoclopramide Hcl Other (See Comments)    REACTION: dyskinesia  . Azithromycin Hives and Swelling    Lip swelling  . Diphenhydramine Hcl Hives  . Ibuprofen Swelling  . Penicillins Hives and Swelling  . Tramadol Hcl Hives and Swelling    Lip swelling    SOCIAL HISTORY:  History  Substance  Use Topics  . Smoking status: Never Smoker   . Smokeless tobacco: Never Used  . Alcohol Use: No    FAMILY HISTORY: Family History  Problem Relation Age of Onset  . Diabetes Other   . Hypertension Other     EXAM: BP 132/80 mmHg  Pulse 101  Temp(Src) 98.3 F (36.8 C)  Resp 18  Ht 5\' 4"  (1.626 m)  Wt 120 lb (54.432 kg)  BMI 20.59 kg/m2  SpO2 99%  LMP 07/20/2014 (Approximate) CONSTITUTIONAL: Alert and oriented and responds appropriately to questions. Well-appearing; well-nourished, nontoxic HEAD: Normocephalic EYES: Conjunctivae clear, PERRL ENT: normal nose; no rhinorrhea; moist mucous membranes; pharynx without lesions noted, no uvular deviation, no trismus or drooling, hoarseness voice but no muffled voice, no stridor, TMs are clear bilaterally NECK: Supple, no meningismus, no LAD , no midline spinal tenderness or step-off or deformity CARD: RRR; S1 and S2 appreciated; no murmurs, no clicks, no rubs, no gallops RESP: Normal chest excursion without splinting or tachypnea; breath sounds clear and equal bilaterally; no wheezes, no rhonchi, no rales, no hypoxia or respiratory distress, speaking full sentences ABD/GI: Normal bowel sounds; non-distended; soft, non-tender, no rebound, no guarding, no peritoneal signs BACK:  The back appears normal and is non-tender to palpation, there is no CVA tenderness EXT: Normal ROM in all joints; non-tender to palpation; no edema; normal capillary refill;  no cyanosis, no calf tenderness or swelling    SKIN: Normal color for age and race; warm NEURO: Moves all extremities equally, sensation to light touch intact diffusely, cranial nerves II through XII intact, normal gait PSYCH: The patient's mood and manner are appropriate. Grooming and personal hygiene are appropriate.  MEDICAL DECISION MAKING: Patient here with likely viral upper respiratory infection. Hemodynamically stable, afebrile, nontoxic. Strep test is negative. Lungs are clear. No  hypoxia. I do not feel she needs any antibiotics. Have also advised her to follow up with her primary care physician for further management for her chronic pain. I do not feel discharging the patient on benzodiazepines at this time is appropriate. Discussed return precautions. We'll provide work note for the next 2 days. Discussed supportive care instructions for her viral illness. She verbalizes understanding and is comfortable with plan.      Layla Maw Joyceann Kruser, DO 07/30/14 [phone]

## 2014-07-30 NOTE — ED Notes (Signed)
Pt c/o sore throat and ear pain.

## 2014-07-30 NOTE — Discharge Instructions (Signed)
You may take Tylenol 1000 mg every 6 hours as needed for fever and pain.  Upper Respiratory Infection, Adult An upper respiratory infection (URI) is also sometimes known as the common cold. The upper respiratory tract includes the nose, sinuses, throat, trachea, and bronchi. Bronchi are the airways leading to the lungs. Most people improve within 1 week, but symptoms can last up to 2 weeks. A residual cough may last even longer.  CAUSES Many different viruses can infect the tissues lining the upper respiratory tract. The tissues become irritated and inflamed and often become very moist. Mucus production is also common. A cold is contagious. You can easily spread the virus to others by oral contact. This includes kissing, sharing a glass, coughing, or sneezing. Touching your mouth or nose and then touching a surface, which is then touched by another person, can also spread the virus. SYMPTOMS  Symptoms typically develop 1 to 3 days after you come in contact with a cold virus. Symptoms vary from person to person. They may include:  Runny nose.  Sneezing.  Nasal congestion.  Sinus irritation.  Sore throat.  Loss of voice (laryngitis).  Cough.  Fatigue.  Muscle aches.  Loss of appetite.  Headache.  Low-grade fever. DIAGNOSIS  You might diagnose your own cold based on familiar symptoms, since most people get a cold 2 to 3 times a year. Your caregiver can confirm this based on your exam. Most importantly, your caregiver can check that your symptoms are not due to another disease such as strep throat, sinusitis, pneumonia, asthma, or epiglottitis. Blood tests, throat tests, and X-rays are not necessary to diagnose a common cold, but they may sometimes be helpful in excluding other more serious diseases. Your caregiver will decide if any further tests are required. RISKS AND COMPLICATIONS  You may be at risk for a more severe case of the common cold if you smoke cigarettes, have chronic  heart disease (such as heart failure) or lung disease (such as asthma), or if you have a weakened immune system. The very young and very old are also at risk for more serious infections. Bacterial sinusitis, middle ear infections, and bacterial pneumonia can complicate the common cold. The common cold can worsen asthma and chronic obstructive pulmonary disease (COPD). Sometimes, these complications can require emergency medical care and may be life-threatening. PREVENTION  The best way to protect against getting a cold is to practice good hygiene. Avoid oral or hand contact with people with cold symptoms. Wash your hands often if contact occurs. There is no clear evidence that vitamin C, vitamin E, echinacea, or exercise reduces the chance of developing a cold. However, it is always recommended to get plenty of rest and practice good nutrition. TREATMENT  Treatment is directed at relieving symptoms. There is no cure. Antibiotics are not effective, because the infection is caused by a virus, not by bacteria. Treatment may include:  Increased fluid intake. Sports drinks offer valuable electrolytes, sugars, and fluids.  Breathing heated mist or steam (vaporizer or shower).  Eating chicken soup or other clear broths, and maintaining good nutrition.  Getting plenty of rest.  Using gargles or lozenges for comfort.  Controlling fevers with ibuprofen or acetaminophen as directed by your caregiver.  Increasing usage of your inhaler if you have asthma. Zinc gel and zinc lozenges, taken in the first 24 hours of the common cold, can shorten the duration and lessen the severity of symptoms. Pain medicines may help with fever, muscle aches, and throat  pain. A variety of non-prescription medicines are available to treat congestion and runny nose. Your caregiver can make recommendations and may suggest nasal or lung inhalers for other symptoms.  HOME CARE INSTRUCTIONS   Only take over-the-counter or  prescription medicines for pain, discomfort, or fever as directed by your caregiver.  Use a warm mist humidifier or inhale steam from a shower to increase air moisture. This may keep secretions moist and make it easier to breathe.  Drink enough water and fluids to keep your urine clear or pale yellow.  Rest as needed.  Return to work when your temperature has returned to normal or as your caregiver advises. You may need to stay home longer to avoid infecting others. You can also use a face mask and careful hand washing to prevent spread of the virus. SEEK MEDICAL CARE IF:   After the first few days, you feel you are getting worse rather than better.  You need your caregiver's advice about medicines to control symptoms.  You develop chills, worsening shortness of breath, or brown or red sputum. These may be signs of pneumonia.  You develop yellow or brown nasal discharge or pain in the face, especially when you bend forward. These may be signs of sinusitis.  You develop a fever, swollen neck glands, pain with swallowing, or white areas in the back of your throat. These may be signs of strep throat. SEEK IMMEDIATE MEDICAL CARE IF:   You have a fever.  You develop severe or persistent headache, ear pain, sinus pain, or chest pain.  You develop wheezing, a prolonged cough, cough up blood, or have a change in your usual mucus (if you have chronic lung disease).  You develop sore muscles or a stiff neck. Document Released: 08/10/2000 Document Revised: 05/09/2011 Document Reviewed: 05/22/2013 [facility] Patient Information 2015 [place] South[place], [place]. This information is not intended to replace advice given to you by your health care provider. Make sure you discuss any questions you have with your health care provider.

## 2014-08-03 LAB — CULTURE, GROUP A STREP

## 2014-08-05 ENCOUNTER — Encounter (HOSPITAL_COMMUNITY): Payer: Self-pay | Admitting: Emergency Medicine

## 2014-08-05 ENCOUNTER — Emergency Department (HOSPITAL_COMMUNITY)
Admission: EM | Admit: 2014-08-05 | Discharge: 2014-08-06 | Disposition: A | Discharge status: Home / Self Care | Payer: Self-pay | Attending: Emergency Medicine | Admitting: Emergency Medicine

## 2014-08-05 DIAGNOSIS — Z8659 Personal history of other mental and behavioral disorders: Secondary | ICD-10-CM | POA: Insufficient documentation

## 2014-08-05 DIAGNOSIS — J069 Acute upper respiratory infection, unspecified: Secondary | ICD-10-CM | POA: Insufficient documentation

## 2014-08-05 DIAGNOSIS — Z8739 Personal history of other diseases of the musculoskeletal system and connective tissue: Secondary | ICD-10-CM | POA: Insufficient documentation

## 2014-08-05 DIAGNOSIS — Z880 Allergy status to penicillin: Secondary | ICD-10-CM | POA: Insufficient documentation

## 2014-08-05 DIAGNOSIS — Z79899 Other long term (current) drug therapy: Secondary | ICD-10-CM | POA: Insufficient documentation

## 2014-08-05 DIAGNOSIS — G8929 Other chronic pain: Secondary | ICD-10-CM | POA: Insufficient documentation

## 2014-08-05 DIAGNOSIS — J040 Acute laryngitis: Secondary | ICD-10-CM | POA: Insufficient documentation

## 2014-08-05 DIAGNOSIS — Z8719 Personal history of other diseases of the digestive system: Secondary | ICD-10-CM | POA: Insufficient documentation

## 2014-08-05 LAB — RAPID STREP SCREEN (MED CTR MEBANE ONLY): Streptococcus, Group A Screen (Direct): NEGATIVE

## 2014-08-05 NOTE — ED Notes (Signed)
Pt c/o sore throat since last week. Pt was seen for the same last week.

## 2014-08-06 NOTE — ED Notes (Signed)
Pt informed to use claritan D every 12 hours for congestion along with tylenol every 4 hours for pain and fever. Pt informed to use salt water gargles to soothe throat and work note given to pt. Pt verbalized discharge instructions.

## 2014-08-06 NOTE — ED Notes (Signed)
   08/06/14 0012  HEENT  HEENT (WDL) X  R Ear Other (Comment) (pain)  L Ear Other (Comment) (pain)  Throat Other (Comment) (pain)  Pt c/o bilateral ear pain and sore throat since last week. Pt was seen for the same last week.

## 2014-08-08 LAB — CULTURE, GROUP A STREP: Strep A Culture: NEGATIVE

## 2014-08-08 NOTE — ED Provider Notes (Signed)
CSN: [id number]     Arrival date & time 08/05/14  2222 History   First MD Initiated Contact with Patient 08/06/14 0007     Chief Complaint  Patient presents with  . Sore Throat     (Consider location/radiation/quality/duration/timing/severity/associated sxs/prior Treatment) Patient is a 29 y.o. female presenting with pharyngitis. The history is provided by the patient.  Sore Throat This is a new problem. The current episode started in the past 7 days. The problem occurs intermittently. The problem has been gradually worsening. Associated symptoms include congestion, coughing and a sore throat. Nothing aggravates the symptoms. The treatment provided no relief.    Past Medical History  Diagnosis Date  . Tachycardia   . Reflux   . Anxiety   . Depression   . Chronic back pain   . Bursitis of right hip   . Bursitis of left hip   . Degenerative disk disease   . Fibromyalgia    Past Surgical History  Procedure Laterality Date  . Knee dislocation surgery    . Knee arthroscopy    . Wisdom tooth extraction    . Tonsillectomy    . Adenoidectomy     Family History  Problem Relation Age of Onset  . Diabetes Other   . Hypertension Other    History  Substance Use Topics  . Smoking status: Never Smoker   . Smokeless tobacco: Never Used  . Alcohol Use: No   OB History    Gravida Para Term Preterm AB TAB SAB Ectopic Multiple Living       Review of Systems  HENT: Positive for congestion, sore throat and voice change.   Respiratory: Positive for cough.   All other systems reviewed and are negative.     Allergies  Hydrocodone; Metoclopramide hcl; Azithromycin; Diphenhydramine hcl; Ibuprofen; Penicillins; and Tramadol hcl  Home Medications   Prior to Admission medications   Medication Sig Start Date End Date Taking? Authorizing Provider  Melatonin 5 MG TABS Take 5 mg by mouth at bedtime.    Historical Provider, MD  methocarbamol (ROBAXIN) 500 MG tablet  Take 1 tablet (500 mg total) by mouth 2 (two) times daily. 07/27/14   Hope Orlene Och, NP   BP 122/93 mmHg  Pulse 80  Temp(Src) 98.2 F (36.8 C) (Oral)  Resp 18  Ht  (1.626 m)  Wt 122 lb (55.339 kg)  BMI 20.93 kg/m2  SpO2 99%  LMP 07/20/2014 (Approximate) Physical Exam  Constitutional: She is oriented to person, place, and time. She appears well-developed and well-nourished.  Non-toxic appearance.  HENT:  Head: Normocephalic.  Right Ear: Tympanic membrane and external ear normal.  Left Ear: Tympanic membrane and external ear normal.  Mouth/Throat: Uvula is midline and mucous membranes are normal. No oral lesions. No trismus in the jaw. No oropharyngeal exudate, posterior oropharyngeal edema or tonsillar abscesses.  Minimal increase redness. Hoarseness of the voice. Nasal congestion  Eyes: EOM and lids are normal. Pupils are equal, round, and reactive to light.  Neck: Normal range of motion. Neck supple. Carotid bruit is not present. No thyroid mass and no thyromegaly present.  Cardiovascular: Normal rate, regular rhythm, normal heart sounds, intact distal pulses and normal pulses.   Pulmonary/Chest: Breath sounds normal. No respiratory distress.  Abdominal: Soft. Bowel sounds are normal. There is no tenderness. There is no guarding.  Musculoskeletal: Normal range of motion.  Lymphadenopathy:       Head (right side):  No submandibular adenopathy present.       Head (left side): No submandibular adenopathy present.    She has no cervical adenopathy.  Neurological: She is alert and oriented to person, place, and time. She has normal strength. No cranial nerve deficit or sensory deficit.  Skin: Skin is warm and dry.  Psychiatric: She has a normal mood and affect. Her speech is normal.  Nursing note and vitals reviewed.   ED Course  Procedures (including critical care time) Labs Review Labs Reviewed  RAPID STREP SCREEN (NOT AT [facility])  CULTURE, GROUP A STREP    Imaging  Review No results found.   EKG Interpretation None      MDM  Pt has been seen previously for this problem. Vital signs non-acute. Strep test negative. Suspect URI with laryngitis. Pt to use salt water gargles, tylenol for fever or aching. Claritin D for congestion. Pt  To see her primary MD for recheck and/or ENT referral if not improving.   Final diagnoses:  None    *I have reviewed nursing notes, vital signs, and all appropriate lab and imaging results for this patient.Ivery Quale, PA-C 08/09/14 1438  Devoria Albe, MD 08/11/14 2306

## 2014-08-09 ENCOUNTER — Emergency Department (HOSPITAL_COMMUNITY)
Admission: EM | Admit: 2014-08-09 | Discharge: 2014-08-09 | Disposition: A | Discharge status: Home / Self Care | Payer: Self-pay | Attending: Emergency Medicine | Admitting: Emergency Medicine

## 2014-08-09 ENCOUNTER — Encounter (HOSPITAL_COMMUNITY): Payer: Self-pay | Admitting: *Deleted

## 2014-08-09 DIAGNOSIS — Z8719 Personal history of other diseases of the digestive system: Secondary | ICD-10-CM | POA: Insufficient documentation

## 2014-08-09 DIAGNOSIS — J069 Acute upper respiratory infection, unspecified: Secondary | ICD-10-CM | POA: Insufficient documentation

## 2014-08-09 DIAGNOSIS — Z8739 Personal history of other diseases of the musculoskeletal system and connective tissue: Secondary | ICD-10-CM | POA: Insufficient documentation

## 2014-08-09 DIAGNOSIS — G8929 Other chronic pain: Secondary | ICD-10-CM | POA: Insufficient documentation

## 2014-08-09 DIAGNOSIS — Z79899 Other long term (current) drug therapy: Secondary | ICD-10-CM | POA: Insufficient documentation

## 2014-08-09 DIAGNOSIS — Z8659 Personal history of other mental and behavioral disorders: Secondary | ICD-10-CM | POA: Insufficient documentation

## 2014-08-09 DIAGNOSIS — J040 Acute laryngitis: Secondary | ICD-10-CM | POA: Insufficient documentation

## 2014-08-09 DIAGNOSIS — Z880 Allergy status to penicillin: Secondary | ICD-10-CM | POA: Insufficient documentation

## 2014-08-09 MED ORDER — PREDNISONE 10 MG PO TABS: 20.0000 mg | ORAL_TABLET | Freq: Two times a day (BID) | ORAL | Status: DC

## 2014-08-09 MED ORDER — DOXYCYCLINE HYCLATE 100 MG PO CAPS: 100.0000 mg | ORAL_CAPSULE | Freq: Two times a day (BID) | ORAL | Status: DC

## 2014-08-09 NOTE — ED Provider Notes (Signed)
CSN: [id number]     Arrival date & time 08/09/14  0329 History   First MD Initiated Contact with Patient 08/09/14 0344     Chief Complaint  Patient presents with  . Cough     (Consider location/radiation/quality/duration/timing/severity/associated sxs/prior Treatment) HPI Comments: Patient is a 29 year old female who presents for the fourth time in the last 10 days for evaluation of cough, sore throat, hoarse voice. She has been taking over-the-counter medications without relief.  Patient is a 29 y.o. female presenting with cough. The history is provided by the patient.  Cough Cough characteristics:  Non-productive Severity:  Moderate Duration:  10 days Timing:  Constant Progression:  Worsening Chronicity:  New Smoker: no   Relieved by:  Nothing Worsened by:  Nothing tried Ineffective treatments:  Cough suppressants and decongestant Associated symptoms: no chest pain and no fever     Past Medical History  Diagnosis Date  . Tachycardia   . Reflux   . Anxiety   . Depression   . Chronic back pain   . Bursitis of right hip   . Bursitis of left hip   . Degenerative disk disease   . Fibromyalgia    Past Surgical History  Procedure Laterality Date  . Knee dislocation surgery    . Knee arthroscopy    . Wisdom tooth extraction    . Tonsillectomy    . Adenoidectomy     Family History  Problem Relation Age of Onset  . Diabetes Other   . Hypertension Other    History  Substance Use Topics  . Smoking status: Never Smoker   . Smokeless tobacco: Never Used  . Alcohol Use: No   OB History    Gravida Para Term Preterm AB TAB SAB Ectopic Multiple Living       Review of Systems  Constitutional: Negative for fever.  Respiratory: Positive for cough.   Cardiovascular: Negative for chest pain.  All other systems reviewed and are negative.     Allergies  Hydrocodone; Metoclopramide hcl; Azithromycin; Diphenhydramine hcl; Ibuprofen; Penicillins; and  Tramadol hcl  Home Medications   Prior to Admission medications   Medication Sig Start Date End Date Taking? Authorizing Provider  Melatonin 5 MG TABS Take 5 mg by mouth at bedtime.    Historical Provider, MD  methocarbamol (ROBAXIN) 500 MG tablet Take 1 tablet (500 mg total) by mouth 2 (two) times daily. 07/27/14   Hope Orlene Och, NP   BP 132/92 mmHg  Pulse 95  Temp(Src) 98.5 F (36.9 C) (Oral)  Resp 20  SpO2 100%  LMP 07/20/2014 (Approximate) Physical Exam  Constitutional: She is oriented to person, place, and time. She appears well-developed and well-nourished. No distress.  HENT:  Head: Normocephalic and atraumatic.  Neck: Normal range of motion. Neck supple.  Cardiovascular: Normal rate and regular rhythm.  Exam reveals no gallop and no friction rub.   No murmur heard. Pulmonary/Chest: Effort normal and breath sounds normal. No respiratory distress. She has no wheezes.  Her voice is hoarse, however breath sounds are otherwise unremarkable.  Abdominal: Soft. Bowel sounds are normal. She exhibits no distension. There is no tenderness.  Musculoskeletal: Normal range of motion.  Neurological: She is alert and oriented to person, place, and time.  Skin: Skin is warm and dry. She is not diaphoretic.  Nursing note and vitals reviewed.   ED Course  Procedures (including critical care time) Labs Review Labs Reviewed - No data  to display  Imaging Review No results found.   EKG Interpretation None      MDM   Final diagnoses:  None    As this is her fourth visit and she is not improving in the past 10 days, will prescribe amoxicillin and prednisone. The patient is to follow-up with her primary Dr. if not improving in the next 2 days. She has made it clear that she requires a work note and I have agreed to put her off for 1 day.    Geoffery Lyons, MD 08/09/14 [phone]

## 2014-08-09 NOTE — ED Notes (Signed)
Pt c/o cough, sore throat, laryngitis, has been seen in er twice for same symptoms with no improvements,

## 2014-08-09 NOTE — ED Notes (Signed)
Dr Delo at bedside,  

## 2014-08-09 NOTE — Discharge Instructions (Signed)
Prednisone as prescribed.  Doxycycline as prescribed.  Follow-up with your primary Dr. if not improving in the next 2 days.   Laryngitis At the top of your windpipe is your voice box. It is the source of your voice. Inside your voice box are 2 bands of muscles called vocal cords. When you breathe, your vocal cords are relaxed and open so that air can get into the lungs. When you decide to say something, these cords come together and vibrate. The sound from these vibrations goes into your throat and comes out through your mouth as sound. Laryngitis is an inflammation of the vocal cords that causes hoarseness, cough, loss of voice, sore throat, and dry throat. Laryngitis can be temporary (acute) or long-term (chronic). Most cases of acute laryngitis improve with time.Chronic laryngitis lasts for more than 3 weeks. CAUSES Laryngitis can often be related to excessive smoking, talking, or yelling, as well as inhalation of toxic fumes and allergies. Acute laryngitis is usually caused by a viral infection, vocal strain, measles or mumps, or bacterial infections. Chronic laryngitis is usually caused by vocal cord strain, vocal cord injury, postnasal drip, growths on the vocal cords, or acid reflux. SYMPTOMS   Cough.  Sore throat.  Dry throat. RISK FACTORS  Respiratory infections.  Exposure to irritating substances, such as cigarette smoke, excessive amounts of alcohol, stomach acids, and workplace chemicals.  Voice trauma, such as vocal cord injury from shouting or speaking too loud. DIAGNOSIS  Your cargiver will perform a physical exam. During the physical exam, your caregiver will examine your throat. The most common sign of laryngitis is hoarseness. Laryngoscopy may be necessary to confirm the diagnosis of this condition. This procedure allows your caregiver to look into the larynx. HOME CARE INSTRUCTIONS  Drink enough fluids to keep your urine clear or pale yellow.  Rest until you no  longer have symptoms or as directed by your caregiver.  Breathe in moist air.  Take all medicine as directed by your caregiver.  Do not smoke.  Talk as little as possible (this includes whispering).  Write on paper instead of talking until your voice is back to normal.  Follow up with your caregiver if your condition has not improved after 10 days. SEEK MEDICAL CARE IF:   You have trouble breathing.  You cough up blood.  You have persistent fever.  You have increasing pain.  You have difficulty swallowing. MAKE SURE YOU:  Understand these instructions.  Will watch your condition.  Will get help right away if you are not doing well or get worse. Document Released: 02/14/2005 Document Revised: 05/09/2011 Document Reviewed: 04/22/2010 [facility] Patient Information 2015 [place], [place]. This information is not intended to replace advice given to you by your health care provider. Make sure you discuss any questions you have with your health care provider.

## 2014-08-10 ENCOUNTER — Emergency Department (HOSPITAL_COMMUNITY)
Admission: EM | Admit: 2014-08-10 | Discharge: 2014-08-10 | Disposition: A | Discharge status: Home / Self Care | Payer: Self-pay | Attending: Emergency Medicine | Admitting: Emergency Medicine

## 2014-08-10 ENCOUNTER — Encounter (HOSPITAL_COMMUNITY): Payer: Self-pay | Admitting: *Deleted

## 2014-08-10 DIAGNOSIS — Z79899 Other long term (current) drug therapy: Secondary | ICD-10-CM

## 2014-08-10 DIAGNOSIS — Z8719 Personal history of other diseases of the digestive system: Secondary | ICD-10-CM | POA: Insufficient documentation

## 2014-08-10 DIAGNOSIS — M797 Fibromyalgia: Secondary | ICD-10-CM | POA: Insufficient documentation

## 2014-08-10 DIAGNOSIS — G8929 Other chronic pain: Secondary | ICD-10-CM | POA: Insufficient documentation

## 2014-08-10 DIAGNOSIS — Z7952 Long term (current) use of systemic steroids: Secondary | ICD-10-CM | POA: Insufficient documentation

## 2014-08-10 DIAGNOSIS — J040 Acute laryngitis: Secondary | ICD-10-CM | POA: Insufficient documentation

## 2014-08-10 DIAGNOSIS — Z880 Allergy status to penicillin: Secondary | ICD-10-CM | POA: Insufficient documentation

## 2014-08-10 DIAGNOSIS — Z8659 Personal history of other mental and behavioral disorders: Secondary | ICD-10-CM | POA: Insufficient documentation

## 2014-08-10 MED ORDER — AMOXICILLIN 500 MG PO CAPS: 500.0000 mg | ORAL_CAPSULE | Freq: Three times a day (TID) | ORAL | Status: DC

## 2014-08-10 NOTE — ED Provider Notes (Signed)
CSN: [id number]     Arrival date & time 08/10/14  0259 History   First MD Initiated Contact with Patient 08/10/14 0310     Chief Complaint  Patient presents with  . Sore Throat     (Consider location/radiation/quality/duration/timing/severity/associated sxs/prior Treatment) HPI  Patient presents with persistent sore throat and cough. Has been seen for previous times for similar complaints. Was told that it was likely viral. Yesterday she was finally given and a biotics and prednisone. She now states that she cannot afford the and a biotics. She is also requesting another work note. She states that she has missed a lot of work because of this. She denies any new symptoms. No fevers. Cough is nonproductive.  Past Medical History  Diagnosis Date  . Tachycardia   . Reflux   . Anxiety   . Depression   . Chronic back pain   . Bursitis of right hip   . Bursitis of left hip   . Degenerative disk disease   . Fibromyalgia    Past Surgical History  Procedure Laterality Date  . Knee dislocation surgery    . Knee arthroscopy    . Wisdom tooth extraction    . Tonsillectomy    . Adenoidectomy     Family History  Problem Relation Age of Onset  . Diabetes Other   . Hypertension Other    History  Substance Use Topics  . Smoking status: Never Smoker   . Smokeless tobacco: Never Used  . Alcohol Use: No   OB History    Gravida Para Term Preterm AB TAB SAB Ectopic Multiple Living       Review of Systems  Constitutional: Negative for fever.  HENT: Positive for sore throat and voice change.   Respiratory: Positive for cough. Negative for shortness of breath.   Cardiovascular: Negative for chest pain.  All other systems reviewed and are negative.     Allergies  Hydrocodone; Metoclopramide hcl; Azithromycin; Diphenhydramine hcl; Ibuprofen; Penicillins; and Tramadol hcl  Home Medications   Prior to Admission medications   Medication Sig Start Date End Date  Taking? Authorizing Provider  amoxicillin (AMOXIL) 500 MG capsule Take 1 capsule (500 mg total) by mouth 3 (three) times daily. 08/10/14   Shon Baton, MD  Melatonin 5 MG TABS Take 5 mg by mouth at bedtime.    Historical Provider, MD  methocarbamol (ROBAXIN) 500 MG tablet Take 1 tablet (500 mg total) by mouth 2 (two) times daily. 07/27/14   Hope Orlene Och, NP  predniSONE (DELTASONE) 10 MG tablet Take 2 tablets (20 mg total) by mouth 2 (two) times daily. 08/09/14   Geoffery Lyons, MD   BP 127/90 mmHg  Pulse 94  Temp(Src) 98.2 F (36.8 C) (Oral)  Resp 20  Ht  (1.626 m)  Wt 122 lb (55.339 kg)  BMI 20.93 kg/m2  SpO2 100%  LMP 07/20/2014 (Approximate) Physical Exam  Constitutional: She is oriented to person, place, and time. She appears well-developed and well-nourished. No distress.  HENT:  Head: Normocephalic and atraumatic.  Mouth/Throat: Oropharynx is clear and moist. No oropharyngeal exudate.  Hoarse voice but not muffled  Neck: Neck supple.  Cardiovascular: Normal rate and regular rhythm.   Pulmonary/Chest: Effort normal. No respiratory distress.  Lymphadenopathy:    She has no cervical adenopathy.  Neurological: She is alert and oriented to person, place, and time.  Skin: Skin is warm and dry.  Psychiatric: She  has a normal mood and affect.  Nursing note and vitals reviewed.   ED Course  Procedures (including critical care time) Labs Review Labs Reviewed - No data to display  Imaging Review No results found.   EKG Interpretation None      MDM   Final diagnoses:  Medication course changed  Laryngitis   Patient presents with persistent symptoms of laryngitis and cough. Reports that she was unable to get her prescription filled secondary to cost issues. She is also requesting a work note. I have reviewed the patient's chart. She was discharged with doxycycline and prednisone. She states that doxycycline is $40. She has allergies listed to penicillin and  azithromycin. I have again discussed with the patient that given her physical exam and history, this is likely a viral illness. Patient is requesting alternative anabiotic. I told her the alternatives are limited secondary to her allergies. Patient states that she has tolerated amoxicillin in the past without hives. This was provided to the patient; however, again discussed with the patient that the course of her illness would likely not change because of antibiotics.  She is requesting a work note again. Discussed with patient that she needs to establish primary doctor. She does not have insurance. Patient was given resources for [facility] free clinic.    After history, exam, and medical workup I feel the patient has been appropriately medically screened and is safe for discharge home. Pertinent diagnoses were discussed with the patient. Patient was given return precautions.     Shon Baton, MD 08/10/14 [phone]

## 2014-08-10 NOTE — Discharge Instructions (Signed)
You were seen today for changing your medication. Your symptoms are still likely related to a viral process. You will be given an antibody which you state you tolerate. However, given that this is likely viral, it may or may not change the course of your illness. You need to rest your voice.  Laryngitis At the top of your windpipe is your voice box. It is the source of your voice. Inside your voice box are 2 bands of muscles called vocal cords. When you breathe, your vocal cords are relaxed and open so that air can get into the lungs. When you decide to say something, these cords come together and vibrate. The sound from these vibrations goes into your throat and comes out through your mouth as sound. Laryngitis is an inflammation of the vocal cords that causes hoarseness, cough, loss of voice, sore throat, and dry throat. Laryngitis can be temporary (acute) or long-term (chronic). Most cases of acute laryngitis improve with time.Chronic laryngitis lasts for more than 3 weeks. CAUSES Laryngitis can often be related to excessive smoking, talking, or yelling, as well as inhalation of toxic fumes and allergies. Acute laryngitis is usually caused by a viral infection, vocal strain, measles or mumps, or bacterial infections. Chronic laryngitis is usually caused by vocal cord strain, vocal cord injury, postnasal drip, growths on the vocal cords, or acid reflux. SYMPTOMS   Cough.  Sore throat.  Dry throat. RISK FACTORS  Respiratory infections.  Exposure to irritating substances, such as cigarette smoke, excessive amounts of alcohol, stomach acids, and workplace chemicals.  Voice trauma, such as vocal cord injury from shouting or speaking too loud. DIAGNOSIS  Your cargiver will perform a physical exam. During the physical exam, your caregiver will examine your throat. The most common sign of laryngitis is hoarseness. Laryngoscopy may be necessary to confirm the diagnosis of this condition. This  procedure allows your caregiver to look into the larynx. HOME CARE INSTRUCTIONS  Drink enough fluids to keep your urine clear or pale yellow.  Rest until you no longer have symptoms or as directed by your caregiver.  Breathe in moist air.  Take all medicine as directed by your caregiver.  Do not smoke.  Talk as little as possible (this includes whispering).  Write on paper instead of talking until your voice is back to normal.  Follow up with your caregiver if your condition has not improved after 10 days. SEEK MEDICAL CARE IF:   You have trouble breathing.  You cough up blood.  You have persistent fever.  You have increasing pain.  You have difficulty swallowing. MAKE SURE YOU:  Understand these instructions.  Will watch your condition.  Will get help right away if you are not doing well or get worse. Document Released: 02/14/2005 Document Revised: 05/09/2011 Document Reviewed: 04/22/2010 [facility] Patient Information 2015 [place], [place]. This information is not intended to replace advice given to you by your health care provider. Make sure you discuss any questions you have with your health care provider.

## 2014-08-10 NOTE — ED Notes (Signed)
Pt states she is still having a sore throat and states she is unable to get her prescription filled due to the cost being $40. Pt states she needs a note for work and another prescription for an antibiotic

## 2015-05-16 ENCOUNTER — Emergency Department (HOSPITAL_COMMUNITY)
Admission: EM | Admit: 2015-05-16 | Discharge: 2015-05-16 | Disposition: A | Discharge status: Home / Self Care | Payer: No Typology Code available for payment source | Attending: Emergency Medicine | Admitting: Emergency Medicine

## 2015-05-16 ENCOUNTER — Encounter (HOSPITAL_COMMUNITY): Payer: Self-pay

## 2015-05-16 DIAGNOSIS — M79604 Pain in right leg: Secondary | ICD-10-CM | POA: Insufficient documentation

## 2015-05-16 DIAGNOSIS — M799 Soft tissue disorder, unspecified: Secondary | ICD-10-CM | POA: Insufficient documentation

## 2015-05-16 DIAGNOSIS — M79605 Pain in left leg: Secondary | ICD-10-CM | POA: Insufficient documentation

## 2015-05-16 DIAGNOSIS — G8929 Other chronic pain: Secondary | ICD-10-CM | POA: Insufficient documentation

## 2015-05-16 DIAGNOSIS — M797 Fibromyalgia: Secondary | ICD-10-CM

## 2015-05-16 DIAGNOSIS — F329 Major depressive disorder, single episode, unspecified: Secondary | ICD-10-CM | POA: Insufficient documentation

## 2015-05-16 MED ORDER — OXYCODONE HCL ER 10 MG PO T12A
10.0000 mg | EXTENDED_RELEASE_TABLET | Freq: Two times a day (BID) | ORAL | Status: DC
  Administered 2015-05-16: 10 mg via ORAL
  Filled 2015-05-16: qty 1

## 2015-05-16 NOTE — Discharge Instructions (Signed)

## 2015-05-16 NOTE — ED Notes (Signed)
Pt states she has fibromyalgia and gets her meds every month from her doctor.   Pt states her appointment was on Wednesday but was cancelled due to her doctor being sick.  Pt states she needs her pain meds and c/o pain to both legs and her left wrist.  Pt denies recent injury

## 2015-05-16 NOTE — ED Provider Notes (Signed)
CSN: [id number]     Arrival date & time 05/16/15  0059 History   First MD Initiated Contact with Patient 05/16/15 0153     Chief Complaint  Patient presents with  . Pain     (Consider location/radiation/quality/duration/timing/severity/associated sxs/prior Treatment) HPI Comments: Pt w/ hx of fibromyalgia comes in with cc of pain. She reports that her pcp was sick and therefore her appointment was moved from 15th to 21st. She hasnt had her pain meds since then, and now she is having wrist pain. Wrist pain is on the L side and both of her legs hurt too. She thinks the pain is from her fibromyalgia and is requesting pain meds to cover until Tuesday. Denies trauma. Denies n/v/f/c.   The history is provided by the patient.    Past Medical History  Diagnosis Date  . Tachycardia   . Reflux   . Anxiety   . Depression   . Chronic back pain   . Bursitis of right hip   . Bursitis of left hip   . Degenerative disk disease   . Fibromyalgia    Past Surgical History  Procedure Laterality Date  . Knee dislocation surgery    . Knee arthroscopy    . Wisdom tooth extraction    . Tonsillectomy    . Adenoidectomy     Family History  Problem Relation Age of Onset  . Diabetes Other   . Hypertension Other    Social History  Substance Use Topics  . Smoking status: Never Smoker   . Smokeless tobacco: Never Used  . Alcohol Use: No   OB History    Gravida Para Term Preterm AB TAB SAB Ectopic Multiple Living       Review of Systems  Musculoskeletal: Positive for arthralgias.  Skin: Negative for wound.      Allergies  Hydrocodone; Metoclopramide hcl; Azithromycin; Diphenhydramine hcl; Ibuprofen; Penicillins; and Tramadol hcl  Home Medications   Prior to Admission medications   Medication Sig Start Date End Date Taking? Authorizing Provider  oxyCODONE (ROXICODONE) 15 MG immediate release tablet Take 10 mg by mouth every 4 (four) hours as needed for pain.   Yes  Historical Provider, MD  pregabalin (LYRICA) 50 MG capsule Take 50 mg by mouth 2 (two) times daily.   Yes Historical Provider, MD  amoxicillin (AMOXIL) 500 MG capsule Take 1 capsule (500 mg total) by mouth 3 (three) times daily. 08/10/14   Shon Baton, MD  Melatonin 5 MG TABS Take 5 mg by mouth at bedtime.    Historical Provider, MD  methocarbamol (ROBAXIN) 500 MG tablet Take 1 tablet (500 mg total) by mouth 2 (two) times daily. 07/27/14   Hope Orlene Och, NP  predniSONE (DELTASONE) 10 MG tablet Take 2 tablets (20 mg total) by mouth 2 (two) times daily. 08/09/14   Geoffery Lyons, MD   BP 145/89 mmHg  Pulse 122  Temp(Src) 98.5 F (36.9 C) (Oral)  Resp 16  Ht  (1.626 m)  Wt 125 lb (56.7 kg)  BMI 21.45 kg/m2  SpO2 100%  LMP 05/04/2015 Physical Exam  Constitutional: She is oriented to person, place, and time. She appears well-developed.  HENT:  Head: Normocephalic and atraumatic.  Eyes: EOM are normal.  Neck: Normal range of motion. Neck supple.  Pulmonary/Chest: Effort normal.  Abdominal: Bowel sounds are normal.  Musculoskeletal:  No gross swelling, erythema, deformity. L wrist ROM is normal and intact.  Neurological: She is alert and oriented to person, place, and time.  Skin: Skin is warm and dry.  Nursing note and vitals reviewed.   ED Course  Procedures (including critical care time) Labs Review Labs Reviewed - No data to display  Imaging Review No results found. I have personally reviewed and evaluated these images and lab results as part of my medical decision-making.   EKG Interpretation None      MDM   Final diagnoses:  Fibromyalgia affecting forearm  Chronic pain    Pt comes in with fibromyalgia pain. She is not in acute distress. Exam is unremarkable. Might have fibromyalgia related pain - but there is nothing acute. I informed her that I don't treat chronic pain with narcotics, and that she can get 1 pill here. Pt not happy about the plan as other  doctors have written her scripts - but i informed her that i am practicing per [employer] policy.   Derwood KaplanAnkit Sayuri Rhames, MD 05/16/15 [phone]

## 2015-09-10 ENCOUNTER — Encounter (HOSPITAL_COMMUNITY): Payer: Self-pay

## 2015-09-10 ENCOUNTER — Emergency Department (HOSPITAL_COMMUNITY)
Admission: EM | Admit: 2015-09-10 | Discharge: 2015-09-10 | Disposition: A | Discharge status: Home / Self Care | Payer: No Typology Code available for payment source | Attending: Emergency Medicine | Admitting: Emergency Medicine

## 2015-09-10 DIAGNOSIS — Z79899 Other long term (current) drug therapy: Secondary | ICD-10-CM | POA: Insufficient documentation

## 2015-09-10 DIAGNOSIS — Y999 Unspecified external cause status: Secondary | ICD-10-CM | POA: Insufficient documentation

## 2015-09-10 DIAGNOSIS — F329 Major depressive disorder, single episode, unspecified: Secondary | ICD-10-CM | POA: Insufficient documentation

## 2015-09-10 DIAGNOSIS — Y929 Unspecified place or not applicable: Secondary | ICD-10-CM | POA: Insufficient documentation

## 2015-09-10 DIAGNOSIS — S0990XA Unspecified injury of head, initial encounter: Secondary | ICD-10-CM | POA: Insufficient documentation

## 2015-09-10 DIAGNOSIS — W1839XA Other fall on same level, initial encounter: Secondary | ICD-10-CM | POA: Insufficient documentation

## 2015-09-10 DIAGNOSIS — Y939 Activity, unspecified: Secondary | ICD-10-CM | POA: Insufficient documentation

## 2015-09-10 MED ORDER — NAPROXEN 500 MG PO TABS: 500.0000 mg | ORAL_TABLET | Freq: Two times a day (BID) | ORAL | Status: AC

## 2015-09-10 MED ORDER — NAPROXEN 250 MG PO TABS
500.0000 mg | ORAL_TABLET | Freq: Once | ORAL | Status: AC
  Administered 2015-09-10: 500 mg via ORAL
  Filled 2015-09-10: qty 2

## 2015-09-10 MED ORDER — OXYCODONE HCL 5 MG PO TABS
10.0000 mg | ORAL_TABLET | Freq: Once | ORAL | Status: AC
  Administered 2015-09-10: 10 mg via ORAL
  Filled 2015-09-10: qty 2

## 2015-09-10 NOTE — Discharge Instructions (Signed)
You were seen today for a head injury. It is likely a minor head injury.  You may have suffered a minor concussion. See concussion precautions below. Use ice to the site and anti-inflammatory medications as needed. If you develop any new or worsening symptoms including worsening headache, persistent headache, vomiting or any new or worsening symptoms she should be reevaluated.  Concussion, Adult A concussion, or closed-head injury, is a brain injury caused by a direct blow to the head or by a quick and sudden movement (jolt) of the head or neck. Concussions are usually not life-threatening. Even so, the effects of a concussion can be serious. If you have had a concussion before, you are more likely to experience concussion-like symptoms after a direct blow to the head.  CAUSES  Direct blow to the head, such as from running into another player during a soccer game, being hit in a fight, or hitting your head on a hard surface.  A jolt of the head or neck that causes the brain to move back and forth inside the skull, such as in a car crash. SIGNS AND SYMPTOMS The signs of a concussion can be hard to notice. Early on, they may be missed by you, family members, and health care providers. You may look fine but act or feel differently. Symptoms are usually temporary, but they may last for days, weeks, or even longer. Some symptoms may appear right away while others may not show up for hours or days. Every head injury is different. Symptoms include:  Mild to moderate headaches that will not go away.  A feeling of pressure inside your head.  Having more trouble than usual:  Learning or remembering things you have heard.  Answering questions.  Paying attention or concentrating.  Organizing daily tasks.  Making decisions and solving problems.  Slowness in thinking, acting or reacting, speaking, or reading.  Getting lost or being easily confused.  Feeling tired all the time or lacking energy  (fatigued).  Feeling drowsy.  Sleep disturbances.  Sleeping more than usual.  Sleeping less than usual.  Trouble falling asleep.  Trouble sleeping (insomnia).  Loss of balance or feeling lightheaded or dizzy.  Nausea or vomiting.  Numbness or tingling.  Increased sensitivity to:  Sounds.  Lights.  Distractions.  Vision problems or eyes that tire easily.  Diminished sense of taste or smell.  Ringing in the ears.  Mood changes such as feeling sad or anxious.  Becoming easily irritated or angry for little or no reason.  Lack of motivation.  Seeing or hearing things other people do not see or hear (hallucinations). DIAGNOSIS Your health care provider can usually diagnose a concussion based on a description of your injury and symptoms. He or she will ask whether you passed out (lost consciousness) and whether you are having trouble remembering events that happened right before and during your injury. Your evaluation might include:  A brain scan to look for signs of injury to the brain. Even if the test shows no injury, you may still have a concussion.  Blood tests to be sure other problems are not present. TREATMENT  Concussions are usually treated in an emergency department, in urgent care, or at a clinic. You may need to stay in the hospital overnight for further treatment.  Tell your health care provider if you are taking any medicines, including prescription medicines, over-the-counter medicines, and natural remedies. Some medicines, such as blood thinners (anticoagulants) and aspirin, may increase the chance of complications. Also  tell your health care provider whether you have had alcohol or are taking illegal drugs. This information may affect treatment.  Your health care provider will send you home with important instructions to follow.  How fast you will recover from a concussion depends on many factors. These factors include how severe your concussion is,  what part of your brain was injured, your age, and how healthy you were before the concussion.  Most people with mild injuries recover fully. Recovery can take time. In general, recovery is slower in older persons. Also, persons who have had a concussion in the past or have other medical problems may find that it takes longer to recover from their current injury. HOME CARE INSTRUCTIONS General Instructions  Carefully follow the directions your health care provider gave you.  Only take over-the-counter or prescription medicines for pain, discomfort, or fever as directed by your health care provider.  Take only those medicines that your health care provider has approved.  Do not drink alcohol until your health care provider says you are well enough to do so. Alcohol and certain other drugs may slow your recovery and can put you at risk of further injury.  If it is harder than usual to remember things, write them down.  If you are easily distracted, try to do one thing at a time. For example, do not try to watch TV while fixing dinner.  Talk with family members or close friends when making important decisions.  Keep all follow-up appointments. Repeated evaluation of your symptoms is recommended for your recovery.  Watch your symptoms and tell others to do the same. Complications sometimes occur after a concussion. Older adults with a brain injury may have a higher risk of serious complications, such as a blood clot on the brain.  Tell your teachers, school nurse, school counselor, coach, athletic trainer, or work Production designer, theatre/television/filmmanager about your injury, symptoms, and restrictions. Tell them about what you can or cannot do. They should watch for:  Increased problems with attention or concentration.  Increased difficulty remembering or learning new information.  Increased time needed to complete tasks or assignments.  Increased irritability or decreased ability to cope with stress.  Increased  symptoms.  Rest. Rest helps the brain to heal. Make sure you:  Get plenty of sleep at night. Avoid staying up late at night.  Keep the same bedtime hours on weekends and weekdays.  Rest during the day. Take daytime naps or rest breaks when you feel tired.  Limit activities that require a lot of thought or concentration. These include:  Doing homework or job-related work.  Watching TV.  Working on the computer.  Avoid any situation where there is potential for another head injury (football, hockey, soccer, basketball, martial arts, downhill snow sports and horseback riding). Your condition will get worse every time you experience a concussion. You should avoid these activities until you are evaluated by the appropriate follow-up health care providers. Returning To Your Regular Activities You will need to return to your normal activities slowly, not all at once. You must give your body and brain enough time for recovery.  Do not return to sports or other athletic activities until your health care provider tells you it is safe to do so.  Ask your health care provider when you can drive, ride a bicycle, or operate heavy machinery. Your ability to react may be slower after a brain injury. Never do these activities if you are dizzy.  Ask your health care provider  about when you can return to work or school. Preventing Another Concussion It is very important to avoid another brain injury, especially before you have recovered. In rare cases, another injury can lead to permanent brain damage, brain swelling, or death. The risk of this is greatest during the first 7-10 days after a head injury. Avoid injuries by:  Wearing a seat belt when riding in a car.  Drinking alcohol only in moderation.  Wearing a helmet when biking, skiing, skateboarding, skating, or doing similar activities.  Avoiding activities that could lead to a second concussion, such as contact or recreational sports, until  your health care provider says it is okay.  Taking safety measures in your home.  Remove clutter and tripping hazards from floors and stairways.  Use grab bars in bathrooms and handrails by stairs.  Place non-slip mats on floors and in bathtubs.  Improve lighting in dim areas. SEEK MEDICAL CARE IF:  You have increased problems paying attention or concentrating.  You have increased difficulty remembering or learning new information.  You need more time to complete tasks or assignments than before.  You have increased irritability or decreased ability to cope with stress.  You have more symptoms than before. Seek medical care if you have any of the following symptoms for more than 2 weeks after your injury:  Lasting (chronic) headaches.  Dizziness or balance problems.  Nausea.  Vision problems.  Increased sensitivity to noise or light.  Depression or mood swings.  Anxiety or irritability.  Memory problems.  Difficulty concentrating or paying attention.  Sleep problems.  Feeling tired all the time. SEEK IMMEDIATE MEDICAL CARE IF:  You have severe or worsening headaches. These may be a sign of a blood clot in the brain.  You have weakness (even if only in one hand, leg, or part of the face).  You have numbness.  You have decreased coordination.  You vomit repeatedly.  You have increased sleepiness.  One pupil is larger than the other.  You have convulsions.  You have slurred speech.  You have increased confusion. This may be a sign of a blood clot in the brain.  You have increased restlessness, agitation, or irritability.  You are unable to recognize people or places.  You have neck pain.  It is difficult to wake you up.  You have unusual behavior changes.  You lose consciousness. MAKE SURE YOU:  Understand these instructions.  Will watch your condition.  Will get help right away if you are not doing well or get worse.   This  information is not intended to replace advice given to you by your health care provider. Make sure you discuss any questions you have with your health care provider.   Document Released: 05/07/2003 Document Revised: 03/07/2014 Document Reviewed: 09/06/2012 Elsevier Interactive Patient Education [employer]2016 [employer]

## 2015-09-10 NOTE — ED Provider Notes (Signed)
CSN: [id number]     Arrival date & time 09/10/15  0141 History   First MD Initiated Contact with Patient 09/10/15 0242     Chief Complaint  Patient presents with  . Facial Injury     (Consider location/radiation/quality/duration/timing/severity/associated sxs/prior Treatment) HPI  This is a 30 year old female who presents with an injury to the head. Patient reports that 2 hours prior to arrival a door that was off the hinges fell and hit her in the forehead. She denies loss of consciousness. She states "I felt days." She denies any vision changes or vomiting. She noted a lump to her for head.  She also noted an abrasion to her nose.  Current pain is 6 out of 10. She takes chronic pain medication for fibromyalgia. Denies any other injury.  Past Medical History  Diagnosis Date  . Tachycardia   . Reflux   . Anxiety   . Depression   . Chronic back pain   . Bursitis of right hip   . Bursitis of left hip   . Degenerative disk disease   . Fibromyalgia    Past Surgical History  Procedure Laterality Date  . Knee dislocation surgery    . Knee arthroscopy    . Wisdom tooth extraction    . Tonsillectomy    . Adenoidectomy     Family History  Problem Relation Age of Onset  . Diabetes Other   . Hypertension Other    Social History  Substance Use Topics  . Smoking status: Never Smoker   . Smokeless tobacco: Never Used  . Alcohol Use: No   OB History    Gravida Para Term Preterm AB TAB SAB Ectopic Multiple Living   0 0 0 0 0 0 0 0 0 0      Review of Systems  HENT: Positive for facial swelling.   Eyes: Negative for visual disturbance.  Gastrointestinal: Negative for nausea and vomiting.  Neurological: Negative for syncope.  All other systems reviewed and are negative.     Allergies  Hydrocodone; Metoclopramide hcl; Azithromycin; Diphenhydramine hcl; Ibuprofen; Penicillins; and Tramadol hcl  Home Medications   Prior to Admission medications   Medication Sig Start Date  End Date Taking? Authorizing Provider  cetirizine (ZYRTEC) 10 MG tablet Take 10 mg by mouth daily.   Yes Historical Provider, MD  oxyCODONE (ROXICODONE) 15 MG immediate release tablet Take 10 mg by mouth every 4 (four) hours as needed for pain.   Yes Historical Provider, MD  pregabalin (LYRICA) 50 MG capsule Take 50 mg by mouth 2 (two) times daily.   Yes Historical Provider, MD  amoxicillin (AMOXIL) 500 MG capsule Take 1 capsule (500 mg total) by mouth 3 (three) times daily. 08/10/14   Shon Batonourtney F Utah Delauder, MD  Melatonin 5 MG TABS Take 5 mg by mouth at bedtime.    Historical Provider, MD  methocarbamol (ROBAXIN) 500 MG tablet Take 1 tablet (500 mg total) by mouth 2 (two) times daily. 07/27/14   Hope Orlene OchM Neese, NP  naproxen (NAPROSYN) 500 MG tablet Take 1 tablet (500 mg total) by mouth 2 (two) times daily with a meal. 09/10/15   Shon Batonourtney F Shayna Eblen, MD  predniSONE (DELTASONE) 10 MG tablet Take 2 tablets (20 mg total) by mouth 2 (two) times daily. 08/09/14   Geoffery Lyonsouglas Delo, MD   BP 127/92 mmHg  Pulse 85  Temp(Src) 98 F (36.7 C) (Oral)  Resp 16  Ht 5\' 4"  (1.626 m)  Wt 126 lb (57.153 kg)  BMI 21.62  kg/m2  SpO2 98%  LMP 08/29/2015 Physical Exam  Constitutional: She is oriented to person, place, and time. She appears well-developed and well-nourished. No distress.  HENT:  Head: Normocephalic and atraumatic.  No evidence of hemotympanum, no Battle sign, no raccoon eyes, mucous membranes moist and clear, hematoma noted to the right for head, no deformities noted, superficial abrasion noted to the bridge of the nose, no obvious deformity, no bleeding from the nares, no septal hematoma  Eyes: EOM are normal. Pupils are equal, round, and reactive to light.  Neck: Normal range of motion. Neck supple.  Cardiovascular: Normal rate and regular rhythm.   Pulmonary/Chest: Effort normal. No respiratory distress.  Neurological: She is alert and oriented to person, place, and time.  Normal gait, 5 out of 5 strength in  all 4 extremities  Skin: Skin is warm and dry.  Psychiatric: She has a normal mood and affect.  Nursing note and vitals reviewed.   ED Course  Procedures (including critical care time) Labs Review Labs Reviewed - No data to display  Imaging Review No results found. I have personally reviewed and evaluated these images and lab results as part of my medical decision-making.   EKG Interpretation None      MDM   Final diagnoses:  Minor head injury, initial encounter    Patient presents with a minor head injury. Nontoxic. Nonfocal. Per [place] CT head rules, patient is low risk and imaging is not indicated. Feels she likely has a minor head injury but minor concussion cannot be ruled out. Discussed this and precautions with the patient. She has pain medication at home. Add naproxen twice a day and ice to the hematoma on her forehead.  After history, exam, and medical workup I feel the patient has been appropriately medically screened and is safe for discharge home. Pertinent diagnoses were discussed with the patient. Patient was given return precautions.     Shon Baton, MD 09/10/15 [phone]

## 2015-09-10 NOTE — ED Notes (Signed)
Pt states a door that was not on it's hinges fell and hit her in the forehead.  Pt felt dazed but no apparent loc. Pt has mild swelling to her forehead at this time.

## 2016-01-21 ENCOUNTER — Emergency Department (HOSPITAL_COMMUNITY)
Admission: EM | Admit: 2016-01-21 | Discharge: 2016-01-21 | Disposition: A | Discharge status: Home / Self Care | Payer: Self-pay | Attending: Emergency Medicine | Admitting: Emergency Medicine

## 2016-01-21 ENCOUNTER — Encounter (HOSPITAL_COMMUNITY): Payer: Self-pay | Admitting: *Deleted

## 2016-01-21 ENCOUNTER — Emergency Department (HOSPITAL_COMMUNITY): Payer: Self-pay

## 2016-01-21 DIAGNOSIS — M778 Other enthesopathies, not elsewhere classified: Secondary | ICD-10-CM

## 2016-01-21 DIAGNOSIS — M65841 Other synovitis and tenosynovitis, right hand: Secondary | ICD-10-CM | POA: Insufficient documentation

## 2016-01-21 DIAGNOSIS — M779 Enthesopathy, unspecified: Secondary | ICD-10-CM

## 2016-01-21 MED ORDER — DICLOFENAC SODIUM 1 % TD GEL: TRANSDERMAL | 0 refills | Status: AC

## 2016-01-21 MED ORDER — ACETAMINOPHEN 500 MG PO TABS
1000.0000 mg | ORAL_TABLET | Freq: Once | ORAL | Status: AC
  Administered 2016-01-21: 1000 mg via ORAL
  Filled 2016-01-21: qty 2

## 2016-01-21 MED ORDER — OXYCODONE-ACETAMINOPHEN 5-325 MG PO TABS
1.0000 | ORAL_TABLET | Freq: Once | ORAL | Status: AC
  Administered 2016-01-21: 1 via ORAL
  Filled 2016-01-21: qty 1

## 2016-01-21 NOTE — ED Notes (Signed)
Verbal order for wrist splint applied.Patient given discharge instruction, verbalized understand. Patient ambulatory out of the department.
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
## 2016-01-21 NOTE — Discharge Instructions (Signed)
Your xray is negative for fracture or dislocation. Your examination suggest tendonitis. Please use diclofenac gel three or four  times daily to the right hand. Use tylenol every 4 hours. Rest your hand on heating pad when resting. See your primary MD for any additional pain management.

## 2018-02-17 ENCOUNTER — Other Ambulatory Visit: Payer: Self-pay

## 2018-02-17 ENCOUNTER — Emergency Department (HOSPITAL_COMMUNITY)
Admission: EM | Admit: 2018-02-17 | Discharge: 2018-02-17 | Disposition: A | Discharge status: Home / Self Care | Payer: Self-pay | Attending: Emergency Medicine | Admitting: Emergency Medicine

## 2018-02-17 ENCOUNTER — Encounter (HOSPITAL_COMMUNITY): Payer: Self-pay

## 2018-02-17 DIAGNOSIS — M797 Fibromyalgia: Secondary | ICD-10-CM | POA: Insufficient documentation

## 2018-02-17 DIAGNOSIS — Z79899 Other long term (current) drug therapy: Secondary | ICD-10-CM | POA: Insufficient documentation

## 2018-02-17 DIAGNOSIS — Z7151 Drug abuse counseling and surveillance of drug abuser: Secondary | ICD-10-CM | POA: Insufficient documentation

## 2018-02-17 DIAGNOSIS — M25552 Pain in left hip: Secondary | ICD-10-CM

## 2018-02-17 DIAGNOSIS — M25551 Pain in right hip: Secondary | ICD-10-CM

## 2018-02-17 MED ORDER — GABAPENTIN 300 MG PO CAPS
300.0000 mg | ORAL_CAPSULE | Freq: Once | ORAL | Status: AC
  Administered 2018-02-17: 300 mg via ORAL
  Filled 2018-02-17: qty 1

## 2018-02-17 MED ORDER — OXYCODONE-ACETAMINOPHEN 5-325 MG PO TABS
2.0000 | ORAL_TABLET | Freq: Once | ORAL | Status: AC
  Administered 2018-02-17: 2 via ORAL
  Filled 2018-02-17: qty 2

## 2018-02-17 NOTE — ED Triage Notes (Signed)
States ran out of gabapentin about one week ago and for the last couple of days complains of leg/hip pain bilaterally has hx of fibromyalgia. Steady gait noted.

## 2018-02-17 NOTE — ED Provider Notes (Signed)
[facility]-EMERGENCY DEPT Provider Note   CSN: [id number] Arrival date & time: 02/17/18  2129     History   Chief Complaint Chief Complaint  Patient presents with  . Leg Pain    hip pain    HPI Adriana Galvan is a 32 y.o. female.  The history is provided by the patient and medical records. No language interpreter was used.  Leg Pain     Adriana Galvan is a 32 y.o. female who presents to the emergency department complaining of chronic back and bilateral hip pain.  She states this is been an ongoing problem for several years.  She was followed by Dr. Ronne BinningMcKenzie and was on 10 mg oxycodone, however has not been on this medication since February.  She states that she has been using leftover gabapentin for a few weeks now that she found to help with her pain.  She denies any acute worsening or change in her pain in the last several months.  No fever.  No new numbness or weakness.  No abdominal pain, nausea, vomiting or urinary symptoms.  She is interested in detox and hoping that she could get in a program tonight.  Past Medical History:  Diagnosis Date  . Anxiety   . Bursitis of left hip   . Bursitis of right hip   . Chronic back pain   . Degenerative disk disease   . Depression   . Fibromyalgia   . Reflux   . Tachycardia     Patient Active Problem List   Diagnosis Date Noted  . Chronic back pain   . Bursitis of right hip   . Bursitis of left hip   . Degenerative disk disease   . ABNORMAL GLANDULAR PAPANICOLAOU SMEAR OF CERVIX 03/15/2010  . HYPERTHYROIDISM 02/05/2010  . DEPRESSION 02/05/2010  . 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 ADENOIDECTOMY    . KNEE ARTHROSCOPY    . KNEE DISLOCATION SURGERY    . TONSILLECTOMY    . WISDOM TOOTH EXTRACTION       OB History    Gravida  0   Para  0   Term  0   Preterm  0   AB  0   Living  0     SAB  0   TAB  0   Ectopic  0   Multiple  0   Live Births               Home Medications    Prior to Admission medications   Medication Sig Start Date End Date Taking? Authorizing Provider  Aspirin-Acetaminophen-Caffeine (GOODY HEADACHE PO) Take 1 packet by mouth daily as needed (FOR  HAND PAIN).    [provider]  cetirizine (ZYRTEC) 10 MG tablet Take 10 mg by mouth daily.    [provider]  diclofenac sodium (VOLTAREN) 1 % GEL Apply to right hand tid 01/21/16   Ivery QualeBryant, Hobson, PA-C  Menthol-Camphor (TIGER BALM PAIN RELIEVING LG EX) Apply 1 application topically daily as needed (FOR PAIN).    [provider]  naproxen (NAPROSYN) 500 MG tablet Take 1 tablet (500 mg total) by mouth 2 (two) times daily with a meal. Patient not taking: Reported on 01/21/2016 09/10/15   Horton, Mayer Maskerourtney F, MD  Oxycodone HCl 10 MG TABS Take 1 tablet by mouth four times a day for pain 01/07/16   [provider]  oxymetazoline (VICKS SINEX) 0.05 %  nasal spray Place 1 spray into both nostrils 2 (two) times daily as needed for congestion.    [provider]  pregabalin (LYRICA) 50 MG capsule Take 50 mg by mouth 2 (two) times daily.    [provider]    Family History Family History  Problem Relation Age of Onset  . Diabetes Other   . Hypertension Other     Social History Social History   Tobacco Use  . Smoking status: Never Smoker  . Smokeless tobacco: Never Used  Substance Use Topics  . Alcohol use: No  . Drug use: No     Allergies   Hydrocodone; Metoclopramide hcl; Azithromycin; Diphenhydramine hcl; Ibuprofen; Penicillins; and Tramadol hcl   Review of Systems Review of Systems  Constitutional: Negative for chills and fever.  Respiratory: Negative for shortness of breath.   Cardiovascular: Negative for chest pain.  Gastrointestinal: Negative for abdominal pain, nausea and vomiting.  Musculoskeletal: Positive for arthralgias and myalgias. Negative for joint swelling.  Skin:  Negative for color change and wound.  Neurological: Negative for weakness.     Physical Exam Updated Vital Signs BP (!) 130/93 (BP Location: Left Arm)   Pulse 87   Temp 97.9 F (36.6 C) (Oral)   Ht 5\' 3"  (1.6 m)   Wt 59.1 kg   SpO2 100%   BMI 23.10 kg/m   Physical Exam Vitals signs and nursing note reviewed.  Constitutional:      General: She is not in acute distress.    Appearance: She is well-developed.  HENT:     Head: Normocephalic and atraumatic.  Cardiovascular:     Rate and Rhythm: Normal rate and regular rhythm.     Heart sounds: Normal heart sounds. No murmur.  Pulmonary:     Effort: Pulmonary effort is normal. No respiratory distress.     Breath sounds: Normal breath sounds.  Abdominal:     General: There is no distension.     Palpations: Abdomen is soft.     Tenderness: There is no abdominal tenderness.  Musculoskeletal:     Comments: Diffuse tenderness to bilateral hips and across the low back.  Straight leg raises are negative bilaterally.  5/5 muscle strength and full range of motion to LE's.  2+ DP and sensation intact bilaterally.  No erythema, ecchymosis or swelling appreciated.  Skin:    General: Skin is warm and dry.  Neurological:     Mental Status: She is alert and oriented to person, place, and time.      ED Treatments / Results  Labs (all labs ordered are listed, but only abnormal results are displayed) Labs Reviewed - No data to display  EKG None  Radiology No results found.  Procedures Procedures (including critical care time)  Medications Ordered in ED Medications  oxyCODONE-acetaminophen (PERCOCET/ROXICET) 5-325 MG per tablet 2 tablet (has no administration in time range)  gabapentin (NEURONTIN) capsule 300 mg (has no administration in time range)     Initial Impression / Assessment and Plan / ED Course  I have reviewed the triage vital signs and the nursing notes.  Pertinent labs & imaging results that were available  during my care of the patient were reviewed by me and considered in my medical decision making (see chart for details).    Adriana Galvan is a 32 y.o. female who presents to ED for chronic back and hip pain which she attributes to her fibromyalgia.  She reports being out of her home pain medication since  she lost her primary care doctor in February.  She denies any acute changes in symptoms over the last several months.  Afebrile and hemodynamically stable.  Bilateral lower extremities neurovascularly intact. No clinical concern for infectious etiology. No acute fall or trauma. Do not feel imaging is warranted today. Discussed this with patient who agrees. She is requesting both refills of her pain medication and gabapentin along with medical detox.  Discussed that I could not refill these medications for her unfortunately and that she will need to see a primary care physician or pain management doctor.  I did give her resource guide for substance abuse and detox facilities and encouraged her to go this route. Evaluation does not show pathology that would require ongoing emergent intervention or inpatient treatment.  Reasons to return to the ER were discussed and all questions answered.  Final Clinical Impressions(s) / ED Diagnoses   Final diagnoses:  Bilateral hip pain  Fibromyalgia    ED Discharge Orders    None       Rihan Schueler, Chase PicketJaime Pilcher, PA-C 02/17/18 2256    Mancel BaleWentz, Elliott, MD 02/18/18 1534

## 2018-02-17 NOTE — Discharge Instructions (Signed)
It was my pleasure taking care of you today!   Unfortunately, we do not have detox at this hospital, however, I have attached information for facilities in the area.   It is important that you find a new primary care doctor. See the information below for help with this.   Return to ER for new or worsening symptoms, any additional concerns.  To find a primary care or specialty doctor please call [phone] or [phone] to access "[place] Find a Doctor Service."  You may also go on the [facility] website at [email or web address]  There are also multiple [facility] and [facility] practices throughout the [place] that are frequently accepting new patients. You may find a clinic that is close to your home and contact them.  [facility] - [address] [id number] [phone]  [facility] in [place] (also locations in [place] and [place]) - [address] [place] [phone]  [facility] - [address] [place] [phone]

## 2018-02-27 ENCOUNTER — Emergency Department (HOSPITAL_COMMUNITY)
Admission: EM | Admit: 2018-02-27 | Discharge: 2018-02-27 | Disposition: A | Discharge status: Home / Self Care | Payer: Self-pay | Attending: Emergency Medicine | Admitting: Emergency Medicine

## 2018-02-27 ENCOUNTER — Encounter (HOSPITAL_COMMUNITY): Payer: Self-pay | Admitting: Emergency Medicine

## 2018-02-27 ENCOUNTER — Other Ambulatory Visit: Payer: Self-pay

## 2018-02-27 DIAGNOSIS — X500XXA Overexertion from strenuous movement or load, initial encounter: Secondary | ICD-10-CM | POA: Insufficient documentation

## 2018-02-27 DIAGNOSIS — Y999 Unspecified external cause status: Secondary | ICD-10-CM | POA: Insufficient documentation

## 2018-02-27 DIAGNOSIS — Y929 Unspecified place or not applicable: Secondary | ICD-10-CM | POA: Insufficient documentation

## 2018-02-27 DIAGNOSIS — S39012A Strain of muscle, fascia and tendon of lower back, initial encounter: Secondary | ICD-10-CM | POA: Insufficient documentation

## 2018-02-27 DIAGNOSIS — Y9389 Activity, other specified: Secondary | ICD-10-CM | POA: Insufficient documentation

## 2018-02-27 MED ORDER — BACLOFEN 10 MG PO TABS: 10.0000 mg | ORAL_TABLET | Freq: Three times a day (TID) | ORAL | 0 refills | Status: AC

## 2018-02-27 MED ORDER — OXYCODONE-ACETAMINOPHEN 5-325 MG PO TABS
1.0000 | ORAL_TABLET | Freq: Once | ORAL | Status: AC
  Administered 2018-02-27: 1 via ORAL
  Filled 2018-02-27: qty 1

## 2018-02-27 MED ORDER — MELOXICAM 15 MG PO TABS: 15.0000 mg | ORAL_TABLET | Freq: Every day | ORAL | 0 refills | Status: AC

## 2018-02-27 NOTE — ED Notes (Signed)
Pt given information for [facility]

## 2018-02-27 NOTE — ED Triage Notes (Addendum)
Patient is complaining of hip pain, lower back pain, and leg pain. Patient states she has fibromyalgia and she exacerbated it by lifting an humidifier today. Patient is also congested and has a cough.

## 2018-02-27 NOTE — ED Provider Notes (Signed)
[facility]-EMERGENCY DEPT Provider Note   CSN: [id number] Arrival date & time: 02/27/18  0037     History   Chief Complaint Chief Complaint  Patient presents with  . Back Pain  . Hip Pain  . Leg Pain    HPI Adriana Galvan is a 32 y.o. female who presents for fibromyalgia exacerbation.  The patient is a former patient of Dr. Billee CashingWayland McKenzie.  She was on 10 mg of oxycodone 3 times daily for her chronic fibromyalgia pain along with gabapentin however since his practice was closed she has been unable to find a provider.  She states that today she leaned over to pick up a dehumidifier and strained her low back causing her to have exacerbation of her chronic fibromyalgia pain.  She denies any new numbness, tingling weakness, saddle anesthesia loss of bowel or bladder continence.  She has pain that is worse with movement, improved with rest.  HPI  Past Medical History:  Diagnosis Date  . Anxiety   . Bursitis of left hip   . Bursitis of right hip   . Chronic back pain   . Degenerative disk disease   . Depression   . Fibromyalgia   . Reflux   . Tachycardia     Patient Active Problem List   Diagnosis Date Noted  . Chronic back pain   . Bursitis of right hip   . Bursitis of left hip   . Degenerative disk disease   . ABNORMAL GLANDULAR PAPANICOLAOU SMEAR OF CERVIX 03/15/2010  . HYPERTHYROIDISM 02/05/2010  . DEPRESSION 02/05/2010  . 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 ADENOIDECTOMY    . KNEE ARTHROSCOPY    . KNEE DISLOCATION SURGERY    . TONSILLECTOMY    . WISDOM TOOTH EXTRACTION       OB History    Gravida  0   Para  0   Term  0   Preterm  0   AB  0   Living  0     SAB  0   TAB  0   Ectopic  0   Multiple  0   Live Births               Home Medications    Prior to Admission medications   Medication Sig Start Date End Date Taking? Authorizing Provider  acetaminophen  (TYLENOL) 500 MG tablet Take 1,000 mg by mouth every 6 (six) hours as needed for moderate pain.   Yes [provider]  albuterol (PROVENTIL HFA;VENTOLIN HFA) 108 (90 Base) MCG/ACT inhaler Inhale 2 puffs into the lungs every 6 (six) hours as needed for wheezing or shortness of breath.   Yes [provider]  Menthol-Camphor (TIGER BALM PAIN RELIEVING LG EX) Apply 1 application topically daily as needed (FOR PAIN).   Yes [provider]  oxymetazoline (VICKS SINEX) 0.05 % nasal spray Place 1 spray into both nostrils 2 (two) times daily as needed for congestion.   Yes [provider]  diclofenac sodium (VOLTAREN) 1 % GEL Apply to right hand tid Patient not taking: Reported on 02/27/2018 01/21/16   Ivery QualeBryant, Hobson, PA-C  naproxen (NAPROSYN) 500 MG tablet Take 1 tablet (500 mg total) by mouth 2 (two) times daily with a meal. Patient not taking: Reported on 01/21/2016 09/10/15   Horton, Mayer Maskerourtney F, MD    Family History Family History  Problem Relation Age of Onset  . Diabetes  Other   . Hypertension Other     Social History Social History   Tobacco Use  . Smoking status: Never Smoker  . Smokeless tobacco: Never Used  Substance Use Topics  . Alcohol use: No  . Drug use: No     Allergies   Hydrocodone; Metoclopramide hcl; Azithromycin; Diphenhydramine hcl; Ibuprofen; Penicillins; and Tramadol hcl   Review of Systems Review of Systems Ten systems reviewed and are negative for acute change, except as noted in the HPI.    Physical Exam Updated Vital Signs BP 119/82 (BP Location: Left Arm)   Pulse 78   Temp 98.5 F (36.9 C) (Oral)   Resp 15   LMP 02/04/2018   SpO2 100%   Physical Exam Vitals signs and nursing note reviewed.  Constitutional:      General: She is not in acute distress.    Appearance: She is well-developed. She is not diaphoretic.  HENT:     Head: Normocephalic and atraumatic.  Eyes:     General: No scleral icterus.     Conjunctiva/sclera: Conjunctivae normal.  Neck:     Musculoskeletal: Normal range of motion.  Cardiovascular:     Rate and Rhythm: Normal rate and regular rhythm.     Heart sounds: Normal heart sounds. No murmur. No friction rub. No gallop.   Pulmonary:     Effort: Pulmonary effort is normal. No respiratory distress.     Breath sounds: Normal breath sounds.  Abdominal:     General: Bowel sounds are normal. There is no distension.     Palpations: Abdomen is soft. There is no mass.     Tenderness: There is no abdominal tenderness. There is no guarding.  Musculoskeletal:     Comments: No midline spinal tenderness.  Exquisitely tender to palpation in the lumbar paraspinal region and bilateral hips.  Normal strength and sensation of the lower extremities.  2+ patellar reflexes bilaterally.  Ambulatory.  Skin:    General: Skin is warm and dry.  Neurological:     Mental Status: She is alert and oriented to person, place, and time.  Psychiatric:        Behavior: Behavior normal.      ED Treatments / Results  Labs (all labs ordered are listed, but only abnormal results are displayed) Labs Reviewed - No data to display  EKG None  Radiology No results found.  Procedures Procedures (including critical care time)  Medications Ordered in ED Medications - No data to display   Initial Impression / Assessment and Plan / ED Course  I have reviewed the triage vital signs and the nursing notes.  Pertinent labs & imaging results that were available during my care of the patient were reviewed by me and considered in my medical decision making (see chart for details).     Patient with back pain.  No neurological deficits and normal neuro exam.  Patient can walk but states is painful.  No loss of bowel or bladder control.  No concern for cauda equina.  No fever, night sweats, weight loss, h/o cancer, IVDU.  RICE protocol and pain medicine indicated and discussed with patient.    Final  Clinical Impressions(s) / ED Diagnoses   Final diagnoses:  None    ED Discharge Orders    None       Arthor CaptainHarris, Kenniyah Sasaki, PA-C 02/27/18 0353    Paula LibraMolpus, John, MD 02/27/18 [phone]

## 2018-02-27 NOTE — Discharge Instructions (Signed)
° °  Get help right away if: °Your back pain is severe. °You cannot stand or walk. °You have difficulty controlling when you urinate or when you have a bowel movement. °You feel nauseous or you vomit. °Your feet get very cold. °You have numbness, tingling, weakness, or problems using your arms or legs. °You develop any of the following: °Shortness of breath. °Dizziness. °Pain in your legs. °Weakness in your buttocks or legs. °Discoloration of the skin on your toes or legs. °

## 2018-09-10 ENCOUNTER — Emergency Department (HOSPITAL_BASED_OUTPATIENT_CLINIC_OR_DEPARTMENT_OTHER): Payer: Self-pay

## 2018-09-10 ENCOUNTER — Encounter (HOSPITAL_BASED_OUTPATIENT_CLINIC_OR_DEPARTMENT_OTHER): Payer: Self-pay

## 2018-09-10 ENCOUNTER — Emergency Department (HOSPITAL_BASED_OUTPATIENT_CLINIC_OR_DEPARTMENT_OTHER)
Admission: EM | Admit: 2018-09-10 | Discharge: 2018-09-10 | Disposition: A | Discharge status: Home / Self Care | Payer: Self-pay | Attending: Emergency Medicine | Admitting: Emergency Medicine

## 2018-09-10 ENCOUNTER — Other Ambulatory Visit: Payer: Self-pay

## 2018-09-10 DIAGNOSIS — N83201 Unspecified ovarian cyst, right side: Secondary | ICD-10-CM | POA: Insufficient documentation

## 2018-09-10 DIAGNOSIS — N939 Abnormal uterine and vaginal bleeding, unspecified: Secondary | ICD-10-CM | POA: Insufficient documentation

## 2018-09-10 DIAGNOSIS — R102 Pelvic and perineal pain: Secondary | ICD-10-CM | POA: Insufficient documentation

## 2018-09-10 DIAGNOSIS — Z79899 Other long term (current) drug therapy: Secondary | ICD-10-CM | POA: Insufficient documentation

## 2018-09-10 DIAGNOSIS — N3001 Acute cystitis with hematuria: Secondary | ICD-10-CM | POA: Insufficient documentation

## 2018-09-10 LAB — CBC
HCT: 44.6 % (ref 36.0–46.0)
Hemoglobin: 14.3 g/dL (ref 12.0–15.0)
MCH: 29.5 pg (ref 26.0–34.0)
MCHC: 32.1 g/dL (ref 30.0–36.0)
MCV: 92.1 fL (ref 80.0–100.0)
Platelets: 253 10*3/uL (ref 150–400)
RBC: 4.84 MIL/uL (ref 3.87–5.11)
RDW: 14.5 % (ref 11.5–15.5)
WBC: 8 10*3/uL (ref 4.0–10.5)
nRBC: 0 % (ref 0.0–0.2)

## 2018-09-10 LAB — WET PREP, GENITAL
Sperm: NONE SEEN
Trich, Wet Prep: NONE SEEN
Yeast Wet Prep HPF POC: NONE SEEN

## 2018-09-10 LAB — URINALYSIS, ROUTINE W REFLEX MICROSCOPIC
Bilirubin Urine: NEGATIVE
Glucose, UA: NEGATIVE mg/dL
Ketones, ur: NEGATIVE mg/dL
Leukocytes,Ua: NEGATIVE
Nitrite: POSITIVE — AB
Protein, ur: NEGATIVE mg/dL
Specific Gravity, Urine: 1.015 (ref 1.005–1.030)
pH: 7.5 (ref 5.0–8.0)

## 2018-09-10 LAB — URINALYSIS, MICROSCOPIC (REFLEX)

## 2018-09-10 LAB — COMPREHENSIVE METABOLIC PANEL
ALT: 22 U/L (ref 0–44)
AST: 20 U/L (ref 15–41)
Albumin: 4 g/dL (ref 3.5–5.0)
Alkaline Phosphatase: 42 U/L (ref 38–126)
Anion gap: 11 (ref 5–15)
BUN: 14 mg/dL (ref 6–20)
CO2: 24 mmol/L (ref 22–32)
Calcium: 9.2 mg/dL (ref 8.9–10.3)
Chloride: 103 mmol/L (ref 98–111)
Creatinine, Ser: 0.89 mg/dL (ref 0.44–1.00)
GFR calc Af Amer: >60 mL/min (ref >60)
GFR calc non Af Amer: >60 mL/min (ref >60)
Glucose, Bld: 98 mg/dL (ref 70–99)
Potassium: 3.7 mmol/L (ref 3.5–5.1)
Sodium: 138 mmol/L (ref 135–145)
Total Bilirubin: 1.1 mg/dL (ref 0.3–1.2)
Total Protein: 7.4 g/dL (ref 6.5–8.1)

## 2018-09-10 LAB — HCG, SERUM, QUALITATIVE: Preg, Serum: NEGATIVE

## 2018-09-10 MED ORDER — FERROUS SULFATE 325 (65 FE) MG PO TABS: 325.0000 mg | ORAL_TABLET | Freq: Every day | ORAL | 0 refills | Status: AC

## 2018-09-10 MED ORDER — SODIUM CHLORIDE 0.9 % IV BOLUS
1000.0000 mL | Freq: Once | INTRAVENOUS | Status: AC
  Administered 2018-09-10: 1000 mL via INTRAVENOUS

## 2018-09-10 MED ORDER — NITROFURANTOIN MONOHYD MACRO 100 MG PO CAPS: 100.0000 mg | ORAL_CAPSULE | Freq: Two times a day (BID) | ORAL | 0 refills | Status: AC

## 2018-09-10 MED ORDER — KETOROLAC TROMETHAMINE 15 MG/ML IJ SOLN
15.0000 mg | Freq: Once | INTRAMUSCULAR | Status: AC
  Administered 2018-09-10: 18:00:00 15 mg via INTRAVENOUS
  Filled 2018-09-10: qty 1

## 2018-09-10 NOTE — ED Provider Notes (Signed)
[facility] EMERGENCY DEPARTMENT Provider Note   CSN: [id number] Arrival date & time: 09/10/18  1612     History   Chief Complaint Chief Complaint  Patient presents with   Vaginal Bleeding    HPI Adriana Galvan is a 33 y.o. female with a hx of anxiety, depression, fibromyalgia, & hyperthyroidism who presents to the ED w/ complaints of vaginal bleeding x 4 days. Patient reports fairly heavy vagina bleeding, having to change tampon every 2-4 hours with passage of some clots. Bleeding is associated w/ pelvic pain & intermittent lightheadedness. No alleviating/aggravating factors.Has had some urinary frequency. Denies fever, chills, dysuria, vaginal discharge or concern for STD. Denies chest pain, dyspnea, or syncope.   Her LMP was 08/07/18. [facility] (Sprintec) 06/29 at their direction. She states this feels somewhat like a period but much heavier bleeding & cramping is more severe.      HPI  Past Medical History:  Diagnosis Date   Anxiety    Bursitis of left hip    Bursitis of right hip    Chronic back pain    Degenerative disk disease    Depression    Fibromyalgia    Reflux    Tachycardia     Patient Active Problem List   Diagnosis Date Noted   Chronic back pain    Bursitis of right hip    Bursitis of left hip    Degenerative disk disease    ABNORMAL GLANDULAR PAPANICOLAOU SMEAR OF CERVIX 03/15/2010   HYPERTHYROIDISM 02/05/2010   DEPRESSION 02/05/2010   SCIATICA, LEFT 02/05/2010    Past Surgical History:  Procedure Laterality Date   ADENOIDECTOMY     KNEE ARTHROSCOPY     KNEE DISLOCATION SURGERY     TONSILLECTOMY     WISDOM TOOTH EXTRACTION       OB History    Gravida  0   Para  0   Term  0   Preterm  0   AB  0   Living  0     SAB  0   TAB  0   Ectopic  0   Multiple  0   Live Births               Home Medications    Prior to Admission medications   Medication  Sig Start Date End Date Taking? Authorizing Provider  acetaminophen (TYLENOL) 500 MG tablet Take 1,000 mg by mouth every 6 (six) hours as needed for moderate pain.    [provider]  albuterol (PROVENTIL HFA;VENTOLIN HFA) 108 (90 Base) MCG/ACT inhaler Inhale 2 puffs into the lungs every 6 (six) hours as needed for wheezing or shortness of breath.    [provider]  baclofen (LIORESAL) 10 MG tablet Take 1 tablet (10 mg total) by mouth 3 (three) times daily. 02/27/18   Arthor CaptainHarris, Abigail, PA-C  diclofenac sodium (VOLTAREN) 1 % GEL Apply to right hand tid Patient not taking: Reported on 02/27/2018 01/21/16   Ivery QualeBryant, Hobson, PA-C  meloxicam (MOBIC) 15 MG tablet Take 1 tablet (15 mg total) by mouth daily. Take 1 daily with food. 02/27/18   Harris, Abigail, PA-C  Menthol-Camphor (TIGER BALM PAIN RELIEVING LG EX) Apply 1 application topically daily as needed (FOR PAIN).    [provider]  naproxen (NAPROSYN) 500 MG tablet Take 1 tablet (500 mg total) by mouth 2 (two) times daily with a meal. Patient not taking: Reported on 01/21/2016 09/10/15  Horton, Mayer Masker, MD  oxymetazoline (VICKS SINEX) 0.05 % nasal spray Place 1 spray into both nostrils 2 (two) times daily as needed for congestion.    [provider]    Family History Family History  Problem Relation Age of Onset   Diabetes Other    Hypertension Other     Social History Social History   Tobacco Use   Smoking status: Never Smoker   Smokeless tobacco: Never Used  Substance Use Topics   Alcohol use: No   Drug use: No     Allergies   Hydrocodone, Metoclopramide hcl, Azithromycin, Diphenhydramine hcl, Ibuprofen, Penicillins, and Tramadol hcl   Review of Systems Review of Systems  Constitutional: Negative for chills and fever.  Respiratory: Negative for shortness of breath.   Cardiovascular: Negative for chest pain.  Gastrointestinal: Negative for diarrhea, nausea and vomiting.    Genitourinary: Positive for frequency, pelvic pain and vaginal bleeding. Negative for dysuria and vaginal discharge.  Neurological: Positive for light-headedness. Negative for syncope.  All other systems reviewed and are negative.    Physical Exam Updated Vital Signs BP (!) 131/95 (BP Location: Left Arm)    Pulse 84    Temp 98.4 F (36.9 C) (Oral)    Resp 16    Ht  (1.626 m)    Wt 70.3 kg    LMP 09/06/2018    SpO2 99%    BMI 26.61 kg/m   Physical Exam Vitals signs and nursing note reviewed. Exam conducted with a chaperone present.  Constitutional:      General: She is not in acute distress.    Appearance: She is well-developed. She is not toxic-appearing.  HENT:     Head: Normocephalic and atraumatic.  Eyes:     General:        Right eye: No discharge.        Left eye: No discharge.     Conjunctiva/sclera: Conjunctivae normal.  Neck:     Musculoskeletal: Neck supple.  Cardiovascular:     Rate and Rhythm: Normal rate and regular rhythm.  Pulmonary:     Effort: Pulmonary effort is normal. No respiratory distress.     Breath sounds: Normal breath sounds. No wheezing, rhonchi or rales.  Abdominal:     General: There is no distension.     Palpations: Abdomen is soft.     Tenderness: There is abdominal tenderness (suprapubic). There is no guarding or rebound.  Genitourinary:    Vagina: Bleeding present. No vaginal discharge.     Cervix: No friability.     Adnexa:        Right: No mass or fullness.         Left: No mass or fullness.       Comments: EDT Marylu Lund present as chaperone Patient diffusely uncomfortable throughout pelvic exam, no focal areas of tenderness.  Skin:    General: Skin is warm and dry.     Findings: No rash.  Neurological:     Mental Status: She is alert.     Comments: Clear speech.   Psychiatric:        Behavior: Behavior normal.    ED Treatments / Results  Labs (all labs ordered are listed, but only abnormal results are displayed) Labs  Reviewed  WET PREP, GENITAL - Abnormal; Notable for the following components:      Result Value   Clue Cells Wet Prep HPF POC PRESENT (*)    WBC, Wet Prep HPF POC MODERATE (*)  All other components within normal limits  URINALYSIS, ROUTINE W REFLEX MICROSCOPIC - Abnormal; Notable for the following components:   APPearance HAZY (*)    Hgb urine dipstick MODERATE (*)    Nitrite POSITIVE (*)    All other components within normal limits  URINALYSIS, MICROSCOPIC (REFLEX) - Abnormal; Notable for the following components:   Bacteria, UA MANY (*)    All other components within normal limits  URINE CULTURE  CBC  COMPREHENSIVE METABOLIC PANEL  HCG, SERUM, QUALITATIVE  RPR  HIV ANTIBODY (ROUTINE TESTING W REFLEX)  GC/CHLAMYDIA PROBE AMP ([place]) NOT AT [facility]    EKG None  Radiology US Transvaginal Non-ob  Result Date: 09/10/2018 CLINICAL DATA:  RIGHT adnexal pain for 2 days. Heavy cycle since 09/05/2018. EXAM: TRANSABDOMINAL ULTRASOUND OF PELVIS DOPPLER ULTRASOUND OF OVARIES TECHNIQUE: Transabdominal ultrasound examination of the pelvis was performed including evaluation of the uterus, ovaries, adnexal regions, and pelvic cul-de-sac. Color and duplex Doppler ultrasound was utilized to evaluate blood flow to the ovaries. COMPARISON:  None. FINDINGS: Uterus Measurements: 7.7 x 3.8 cm. 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 No fibroids or other mass visualized. Endometrium Thickness: 12-13 mm.  No focal abnormality visualized. Right ovary Measurements: 4.9 x 3.4 x 3.8 cm = volume: 33 mL. Hypoechoic mass within the RIGHT ovary measures 3.3 cm, with homogeneous internal echoes, without internal vascularity, consistent with hemorrhagic cyst or less likely endometrioma. Left ovary Measurements: 3.9 x 2.3 x 2.6 cm = volume: 12.2 mL. Normal appearance/no adnexal mass. Pulsed Doppler evaluation demonstrates normal low-resistance arterial and venous waveforms in both ovaries. Other: Small amount  of free fluid in the RIGHT adnexal region. IMPRESSION: 1. Small hemorrhagic cysts within the RIGHT ovary, largest measuring 3.3 cm, less likely endometriomas. Small amount of free fluid in the RIGHT adnexal region, likely physiologic in nature. 2. Remainder of the exam is unremarkable, as detailed above. No evidence of ovarian torsion. Uterus is unremarkable. 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 Electronically Signed   By: Franki Cabot M.D.   On: 09/10/2018 20:02   US Pelvis Complete  Result Date: 09/10/2018 CLINICAL DATA:  RIGHT adnexal pain for 2 days. Heavy cycle since 09/05/2018. EXAM: TRANSABDOMINAL ULTRASOUND OF PELVIS DOPPLER ULTRASOUND OF OVARIES TECHNIQUE: Transabdominal ultrasound examination of the pelvis was performed including evaluation of the uterus, ovaries, adnexal regions, and pelvic cul-de-sac. Color and duplex Doppler ultrasound was utilized to evaluate blood flow to the ovaries. COMPARISON:  None. FINDINGS: Uterus Measurements: 7.7 x 3.8 cm. 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 No fibroids or other mass visualized. Endometrium Thickness: 12-13 mm.  No focal abnormality visualized. Right ovary Measurements: 4.9 x 3.4 x 3.8 cm = volume: 33 mL. Hypoechoic mass within the RIGHT ovary measures 3.3 cm, with homogeneous internal echoes, without internal vascularity, consistent with hemorrhagic cyst or less likely endometrioma. Left ovary Measurements: 3.9 x 2.3 x 2.6 cm = volume: 12.2 mL. Normal appearance/no adnexal mass. Pulsed Doppler evaluation demonstrates normal low-resistance arterial and venous waveforms in both ovaries. Other: Small amount of free fluid in the RIGHT adnexal region. IMPRESSION: 1. Small hemorrhagic cysts within the RIGHT ovary, largest measuring 3.3 cm, less likely endometriomas. Small amount of free fluid in the RIGHT adnexal region, likely physiologic in nature. 2. Remainder of the exam is unremarkable, as detailed above. No  evidence of ovarian torsion. Uterus is unremarkable. 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 Electronically Signed   By: Franki Cabot M.D.   On: 09/10/2018 20:02  [place] Art/ven Flow Abd Pelv Doppler  Result Date: 09/10/2018 CLINICAL DATA:  RIGHT adnexal pain for 2 days. Heavy cycle since 09/05/2018. EXAM: TRANSABDOMINAL ULTRASOUND OF PELVIS DOPPLER ULTRASOUND OF OVARIES TECHNIQUE: Transabdominal ultrasound examination of the pelvis was performed including evaluation of the uterus, ovaries, adnexal regions, and pelvic cul-de-sac. Color and duplex Doppler ultrasound was utilized to evaluate blood flow to the ovaries. COMPARISON:  None. FINDINGS: Uterus Measurements: 7.7 x 3.8 cm. 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 No fibroids or other mass visualized. Endometrium Thickness: 12-13 mm.  No focal abnormality visualized. Right ovary Measurements: 4.9 x 3.4 x 3.8 cm = volume: 33 mL. Hypoechoic mass within the RIGHT ovary measures 3.3 cm, with homogeneous internal echoes, without internal vascularity, consistent with hemorrhagic cyst or less likely endometrioma. Left ovary Measurements: 3.9 x 2.3 x 2.6 cm = volume: 12.2 mL. Normal appearance/no adnexal mass. Pulsed Doppler evaluation demonstrates normal low-resistance arterial and venous waveforms in both ovaries. Other: Small amount of free fluid in the RIGHT adnexal region. IMPRESSION: 1. Small hemorrhagic cysts within the RIGHT ovary, largest measuring 3.3 cm, less likely endometriomas. Small amount of free fluid in the RIGHT adnexal region, likely physiologic in nature. 2. Remainder of the exam is unremarkable, as detailed above. No evidence of ovarian torsion. Uterus is unremarkable. 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 Electronically Signed   By: Bary RichardStan  Maynard M.D.   On: 09/10/2018 20:02    Procedures Procedures (including critical care time)  Medications Ordered in ED Medications    sodium chloride 0.9 % bolus 1,000 mL (0 mLs Intravenous Stopped 09/10/18 1840)  ketorolac (TORADOL) 15 MG/ML injection 15 mg (15 mg Intravenous Given 09/10/18 1758)     Initial Impression / Assessment and Plan / ED Course  I have reviewed the triage vital signs and the nursing notes.  Pertinent labs & imaging results that were available during my care of the patient were reviewed by me and considered in my medical decision making (see chart for details).   Patient presents to the ED for evaluation of vaginal bleeding & pelvic pain with some associated intermittent lightheadedness. LMP 06/09, recently started OCP 06/29- taking as prescribed. Suprapubic tenderness & diffuse discomfort w/ bimanual exam. No peritoneal signs. Vaginal bleeding noted.   CBC: No anemia or leukocytosis.  CMP: Unremarkable.  UA: Concerning for UTI- urine culture sent, will cover w/ macrobid as this could be contributing to her sxs & as she has had urinary frequency.  Wet prep: BV- no complaints of vaginal discharge, discussed w/ patient & will defer tx.  Preg test: Negative- doubt ectopic or spontaneous abortion.  GC/chlamydia/RPR/HIV pending--- patient w/o concern for STD, feel PID is less likely.    Ultrasound:  1. Small hemorrhagic cysts within the RIGHT ovary, largest measuring 3.3 cm, less likely endometriomas. Small amount of free fluid in the RIGHT adnexal region, likely physiologic in nature. 2. Remainder of the exam is unremarkable, as detailed above. No evidence of ovarian torsion. Uterus is unremarkable. 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  Given time of month w/ patient's LMP 08/07/18- suspect this is her typical menstrual cycle, OCP only started 2 weeks prior. Ovarian cyst & UTI likely playing a factor- macrobid for UTI. She has had some symptomatic improvement in the ED. She takes diclofenac outpatient for chronic pain which should help w/ cyst & menstrual cramps. Her hgb/hct  are stable, but will provide iron supplement to take as she is concerned about her blood  loss. I discussed findings & plan of care w/ supervising physician Dr. Deretha EmoryZackowski- recommends continuing sprintec & patient calling her prescriber tomorrow to discuss management, would not change/stop/add medicine- I am in agreement. I discussed results, treatment plan, need for follow-up, and return precautions with the patient. Provided opportunity for questions, patient confirmed understanding and is in agreement with plan.   Findings and plan of care discussed with supervising physician Dr. Deretha EmoryZackowski who is in agreement.   Final Clinical Impressions(s) / ED Diagnoses   Final diagnoses:  Vaginal bleeding  Cyst of right ovary  Acute cystitis with hematuria    ED Discharge Orders         Ordered    nitrofurantoin, macrocrystal-monohydrate, (MACROBID) 100 MG capsule  2 times daily     09/10/18 2108    ferrous sulfate 325 (65 FE) MG tablet  Daily     09/10/18 2108           Cherly Andersonetrucelli, Jahbari Repinski R, PA-C 09/10/18 2110    Vanetta MuldersZackowski, Scott, MD 09/11/18 1627

## 2018-09-10 NOTE — ED Triage Notes (Signed)
Pt c/o vaginal bleeding since 7/9-NAD-steady gait

## 2018-09-10 NOTE — ED Notes (Signed)
Pt. Reports she has felt weak due to using too many tampons and the amount of blood she has lost with this period.

## 2018-09-10 NOTE — Discharge Instructions (Addendum)
You were seen in the ER today for vaginal bleeding & pelvic pain.   Your emergency department work-up was overall reassuring.  Your hemoglobin and hematocrit are stable, these are indicators of blood loss.  Your ultrasound showed that you have a right-sided ovarian cyst which may be contributing to your discomfort.  Your urine showed findings consistent with a urinary tract infection which we are treating with Macrobid, an antibiotic.  Prior hemoglobin and hematocrit are stable we are providing prescription for an iron supplement as well.   We have prescribed you new medication(s) today. Discuss the medications prescribed today with your pharmacist as they can have adverse effects and interactions with your other medicines including over the counter and prescribed medications. Seek medical evaluation if you start to experience new or abnormal symptoms after taking one of these medicines, seek care immediately if you start to experience difficulty breathing, feeling of your throat closing, facial swelling, or rash as these could be indications of a more serious allergic reaction  Please continue to take your pain medications at home as prescribed.  [facility] to discuss your bleeding on the Sprintec medicine, you may also follow-up with our OB/GYN providers outpatient.  Return to the emergency department for new or worsening symptoms or any other concerns.

## 2018-09-11 LAB — HIV ANTIBODY (ROUTINE TESTING W REFLEX): HIV Screen 4th Generation wRfx: NONREACTIVE

## 2018-09-11 LAB — RPR: RPR Ser Ql: NONREACTIVE

## 2018-09-11 LAB — GC/CHLAMYDIA PROBE AMP (~~LOC~~) NOT AT ARMC
Chlamydia: NEGATIVE
Neisseria Gonorrhea: NEGATIVE

## 2018-09-12 LAB — URINE CULTURE: Culture: >=100000 — AB

## 2018-09-13 ENCOUNTER — Telehealth: Payer: Self-pay | Admitting: *Deleted

## 2018-09-13 NOTE — Telephone Encounter (Signed)
Post ED Visit - Positive Culture Follow-up  Culture report reviewed by antimicrobial stewardship pharmacist: [place] Team []  Elenor Quinones, Pharm.D. []  Heide Guile, Pharm.D., BCPS AQ-ID []  Parks Neptune, Pharm.D., BCPS []  Alycia Rossetti, Pharm.D., BCPS []  [place], Pharm.D., BCPS, AAHIVP []  Legrand Como, Pharm.D., BCPS, AAHIVP []  Salome Arnt, PharmD, BCPS []  Johnnette Gourd, PharmD, BCPS [x]  Hughes Better, PharmD, BCPS []  Leeroy Cha, PharmD []  Laqueta Linden, PharmD, BCPS []  Albertina Parr, PharmD  [place] Team []  Leodis Sias, PharmD []  Lindell Spar, PharmD []  Royetta Asal, PharmD []  Graylin Shiver, Rph []  Rema Fendt) Glennon Mac, PharmD []  Arlyn Dunning, PharmD []  Netta Cedars, PharmD []  Dia Sitter, PharmD []  Leone Haven, PharmD []  Gretta Arab, PharmD []  Theodis Shove, PharmD []  Peggyann Juba, PharmD []  Reuel Boom, PharmD   Positive urine culture Treated with Nitrofurantoin Monohyd Macro, organism sensitive to the same and no further patient follow-up is required at this time.  Harlon Flor [facility] 09/13/2018, 11:45 AM

## 2019-09-05 ENCOUNTER — Encounter (HOSPITAL_BASED_OUTPATIENT_CLINIC_OR_DEPARTMENT_OTHER): Payer: Self-pay | Admitting: Emergency Medicine

## 2019-09-05 ENCOUNTER — Other Ambulatory Visit: Payer: Self-pay

## 2019-09-05 ENCOUNTER — Emergency Department (HOSPITAL_BASED_OUTPATIENT_CLINIC_OR_DEPARTMENT_OTHER)
Admission: EM | Admit: 2019-09-05 | Discharge: 2019-09-05 | Disposition: A | Discharge status: Home / Self Care | Attending: Emergency Medicine | Admitting: Emergency Medicine

## 2019-09-05 DIAGNOSIS — R350 Frequency of micturition: Secondary | ICD-10-CM | POA: Insufficient documentation

## 2019-09-05 DIAGNOSIS — N939 Abnormal uterine and vaginal bleeding, unspecified: Secondary | ICD-10-CM | POA: Diagnosis not present

## 2019-09-05 DIAGNOSIS — R1031 Right lower quadrant pain: Secondary | ICD-10-CM | POA: Diagnosis present

## 2019-09-05 DIAGNOSIS — R42 Dizziness and giddiness: Secondary | ICD-10-CM | POA: Diagnosis not present

## 2019-09-05 DIAGNOSIS — R102 Pelvic and perineal pain: Secondary | ICD-10-CM | POA: Insufficient documentation

## 2019-09-05 DIAGNOSIS — R110 Nausea: Secondary | ICD-10-CM | POA: Diagnosis not present

## 2019-09-05 HISTORY — DX: Unspecified ovarian cyst, unspecified side: N83.209

## 2019-09-05 LAB — BASIC METABOLIC PANEL
Anion gap: 9 (ref 5–15)
BUN: 22 mg/dL — ABNORMAL HIGH (ref 6–20)
CO2: 24 mmol/L (ref 22–32)
Calcium: 9.2 mg/dL (ref 8.9–10.3)
Chloride: 104 mmol/L (ref 98–111)
Creatinine, Ser: 0.71 mg/dL (ref 0.44–1.00)
GFR calc Af Amer: >60 mL/min (ref >60)
GFR calc non Af Amer: >60 mL/min (ref >60)
Glucose, Bld: 94 mg/dL (ref 70–99)
Potassium: 3.4 mmol/L — ABNORMAL LOW (ref 3.5–5.1)
Sodium: 137 mmol/L (ref 135–145)

## 2019-09-05 LAB — URINALYSIS, MICROSCOPIC (REFLEX): WBC, UA: NONE SEEN WBC/hpf (ref 0–5)

## 2019-09-05 LAB — CBC WITH DIFFERENTIAL/PLATELET
Abs Immature Granulocytes: 0.06 10*3/uL (ref 0.00–0.07)
Basophils Absolute: 0 10*3/uL (ref 0.0–0.1)
Basophils Relative: 0 %
Eosinophils Absolute: 0 10*3/uL (ref 0.0–0.5)
Eosinophils Relative: 0 %
HCT: 42.6 % (ref 36.0–46.0)
Hemoglobin: 14 g/dL (ref 12.0–15.0)
Immature Granulocytes: 1 %
Lymphocytes Relative: 24 %
Lymphs Abs: 2.5 10*3/uL (ref 0.7–4.0)
MCH: 30.5 pg (ref 26.0–34.0)
MCHC: 32.9 g/dL (ref 30.0–36.0)
MCV: 92.8 fL (ref 80.0–100.0)
Monocytes Absolute: 0.7 10*3/uL (ref 0.1–1.0)
Monocytes Relative: 6 %
Neutro Abs: 7.1 10*3/uL (ref 1.7–7.7)
Neutrophils Relative %: 69 %
Platelets: 306 10*3/uL (ref 150–400)
RBC: 4.59 MIL/uL (ref 3.87–5.11)
RDW: 14.7 % (ref 11.5–15.5)
WBC: 10.3 10*3/uL (ref 4.0–10.5)
nRBC: 0 % (ref 0.0–0.2)

## 2019-09-05 LAB — URINALYSIS, ROUTINE W REFLEX MICROSCOPIC
Bilirubin Urine: NEGATIVE
Glucose, UA: NEGATIVE mg/dL
Ketones, ur: NEGATIVE mg/dL
Leukocytes,Ua: NEGATIVE
Nitrite: NEGATIVE
Protein, ur: NEGATIVE mg/dL
Specific Gravity, Urine: 1.025 (ref 1.005–1.030)
pH: 6 (ref 5.0–8.0)

## 2019-09-05 LAB — WET PREP, GENITAL
Clue Cells Wet Prep HPF POC: NONE SEEN
Sperm: NONE SEEN
Trich, Wet Prep: NONE SEEN
Yeast Wet Prep HPF POC: NONE SEEN

## 2019-09-05 LAB — PREGNANCY, URINE: Preg Test, Ur: NEGATIVE

## 2019-09-05 NOTE — ED Triage Notes (Signed)
Right lower abd pain x 3-4 days with heavy bleeding. Pt states symptoms similar to previous ovarian cyst rupture.

## 2019-09-05 NOTE — Discharge Instructions (Signed)
Please follow up with [facility] located at: [address] [place], [place] [address] [phone]  You can also follow [facility] with your [facility]  Continue taking your birth control as prescribed. Use 800 mg Ibuprofen ever 8 hours and 1,000 mg Tylenol every 8 hours as needed for pain.   Return to the ED IMMEDIATELY for any worsening symptoms including worsening pain, excessive bleeding that lasts longer than 9-10 days, dizziness, lightheadedness, vision changes, chest pain, shortness of breath, passing out, or any other new/concerning symptoms.

## 2019-09-05 NOTE — ED Provider Notes (Signed)
[facility] EMERGENCY DEPARTMENT Provider Note   CSN: [id number] Arrival date & time: 09/05/19  1952     History Chief Complaint  Patient presents with  . Abdominal Pain    Adriana Galvan is a 34 y.o. female with PMhx fibromyalgia, depression, and ruptured ovarian cyst who presents to the ED today with complaint of gradual onset, constant, sharp/achy, RLQ and suprapubic abdominal pain/pelvic pain that began 3 days ago. Pt also complains of heavy vaginal bleeding for the past 3 days. She reports going through approximately 4 regular sized tampons today from around 5 AM - noon. She is also passing clots. Pt states she recently started back on her birth control Sprintec on June 9th. She skipped the placebos at the end of the pack and went directly to a second pack about 1 week ago and then began bleeding 4 days later. Pt states this does not feel like her regular menstrual cycle as she typically does not have this much pain with it. She does mention hx of similar episode last year when she was seen in the ED and told she had a ruptured ovarian cyst. Pt is also complaining of some urinary frequency which she had last time as well when she was diagnosed with a UTI. She states she has felt lightheaded with the blood loss however denies fevers, chills, headache, vision changes, chest pain, SOB, vomiting, or any other associated symptoms.   The history is provided by the patient and medical records.       Past Medical History:  Diagnosis Date  . Anxiety   . Bursitis of left hip   . Bursitis of right hip   . Chronic back pain   . Degenerative disk disease   . Depression   . Fibromyalgia   . Ovarian cyst rupture   . Reflux   . Tachycardia     Patient Active Problem List   Diagnosis Date Noted  . Chronic back pain   . Bursitis of right hip   . Bursitis of left hip   . Degenerative disk disease   . ABNORMAL GLANDULAR PAPANICOLAOU SMEAR OF CERVIX 03/15/2010  . HYPERTHYROIDISM  02/05/2010  . DEPRESSION 02/05/2010  . 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 ADENOIDECTOMY    . KNEE ARTHROSCOPY    . KNEE DISLOCATION SURGERY    . TONSILLECTOMY    . WISDOM TOOTH EXTRACTION       OB History    Gravida  0   Para  0   Term  0   Preterm  0   AB  0   Living  0     SAB  0   TAB  0   Ectopic  0   Multiple  0   Live Births              Family History  Problem Relation Age of Onset  . Diabetes Other   . Hypertension Other     Social History   Tobacco Use  . Smoking status: Never Smoker  . Smokeless tobacco: Never Used  Vaping Use  . Vaping Use: Never used  Substance Use Topics  . Alcohol use: No  . Drug use: No    Home Medications Prior to Admission medications   Medication Sig Start Date End Date Taking? Authorizing Provider  acetaminophen (TYLENOL) 500 MG tablet Take 1,000 mg by mouth every 6 (six) hours as needed for moderate pain.  [provider]  albuterol (PROVENTIL HFA;VENTOLIN HFA) 108 (90 Base) MCG/ACT inhaler Inhale 2 puffs into the lungs every 6 (six) hours as needed for wheezing or shortness of breath.    [provider]  baclofen (LIORESAL) 10 MG tablet Take 1 tablet (10 mg total) by mouth 3 (three) times daily. 02/27/18   Arthor Captain, PA-C  diclofenac sodium (VOLTAREN) 1 % GEL Apply to right hand tid Patient not taking: Reported on 02/27/2018 01/21/16   Ivery Quale, PA-C  ferrous sulfate 325 (65 FE) MG tablet Take 1 tablet (325 mg total) by mouth daily. 09/10/18   Petrucelli, Pleas Koch, PA-C  meloxicam (MOBIC) 15 MG tablet Take 1 tablet (15 mg total) by mouth daily. Take 1 daily with food. 02/27/18   Harris, Abigail, PA-C  Menthol-Camphor (TIGER BALM PAIN RELIEVING LG EX) Apply 1 application topically daily as needed (FOR PAIN).    [provider]  naproxen (NAPROSYN) 500 MG tablet Take 1 tablet (500 mg total) by mouth 2 (two) times daily  with a meal. Patient not taking: Reported on 01/21/2016 09/10/15   Horton, Mayer Masker, MD  nitrofurantoin, macrocrystal-monohydrate, (MACROBID) 100 MG capsule Take 1 capsule (100 mg total) by mouth 2 (two) times daily. 09/10/18   Petrucelli, Samantha R, PA-C  oxymetazoline (VICKS SINEX) 0.05 % nasal spray Place 1 spray into both nostrils 2 (two) times daily as needed for congestion.    [provider]    Allergies    Hydrocodone, Metoclopramide hcl, Azithromycin, Diphenhydramine hcl, Ibuprofen, Penicillins, and Tramadol hcl  Review of Systems   Review of Systems  Constitutional: Negative for chills and fever.  Eyes: Negative for visual disturbance.  Respiratory: Negative for shortness of breath.   Cardiovascular: Negative for chest pain.  Gastrointestinal: Positive for abdominal pain and nausea. Negative for constipation, diarrhea and vomiting.  Genitourinary: Positive for frequency, pelvic pain and vaginal bleeding. Negative for difficulty urinating, dysuria and flank pain.  Neurological: Positive for light-headedness. Negative for syncope.  All other systems reviewed and are negative.   Physical Exam Updated Vital Signs BP 140/90 (BP Location: Right Arm)   Pulse 86   Temp 98.2 F (36.8 C) (Oral)   Resp 18   Ht 5\' 3"  (1.6 m)   Wt 68.5 kg   LMP 09/03/2019 (Approximate)   SpO2 98%   BMI 26.75 kg/m   Physical Exam Vitals and nursing note reviewed.  Constitutional:      Appearance: She is not ill-appearing.  HENT:     Head: Normocephalic and atraumatic.  Eyes:     Conjunctiva/sclera: Conjunctivae normal.  Cardiovascular:     Rate and Rhythm: Normal rate and regular rhythm.     Heart sounds: Normal heart sounds.  Pulmonary:     Effort: Pulmonary effort is normal.     Breath sounds: Normal breath sounds. No wheezing, rhonchi or rales.  Abdominal:     Palpations: Abdomen is soft.     Tenderness: There is abdominal tenderness in the right lower quadrant and  suprapubic area. There is no right CVA tenderness, left CVA tenderness, guarding or rebound.  Genitourinary:    Comments: Chaperone present for exam. No rashes, lesions, or tenderness to external genitalia. No erythema, injury, or tenderness to vaginal mucosa. Moderate amount of blood in vault. No adnexal masses, tenderness, or fullness. No CMT, cervical friability, or discharge from cervical os. Cervical os is closed. Uterus non-deviated, mobile, nonTTP, and without enlargement.  Musculoskeletal:     Cervical back: Neck supple.  Skin:    General: Skin is warm and dry.  Neurological:     Mental Status: She is alert.     ED Results / Procedures / Treatments   Labs (all labs ordered are listed, but only abnormal results are displayed) Labs Reviewed  WET PREP, GENITAL - Abnormal; Notable for the following components:      Result Value   WBC, Wet Prep HPF POC FEW (*)    All other components within normal limits  URINALYSIS, ROUTINE W REFLEX MICROSCOPIC - Abnormal; Notable for the following components:   Hgb urine dipstick LARGE (*)    All other components within normal limits  BASIC METABOLIC PANEL - Abnormal; Notable for the following components:   Potassium 3.4 (*)    BUN 22 (*)    All other components within normal limits  URINALYSIS, MICROSCOPIC (REFLEX) - Abnormal; Notable for the following components:   Bacteria, UA FEW (*)    All other components within normal limits  PREGNANCY, URINE  CBC WITH DIFFERENTIAL/PLATELET  HIV ANTIBODY (ROUTINE TESTING W REFLEX)  RPR  GC/CHLAMYDIA PROBE AMP ([place]) NOT AT [facility]    EKG None  Radiology No results found.  Procedures Procedures (including critical care time)  Medications Ordered in ED Medications - No data to display  ED Course  I have reviewed the triage vital signs and the nursing notes.  Pertinent labs & imaging results that were available during my care of the patient were reviewed by me and considered in my  medical decision making (see chart for details).  Clinical Course as of Sep 04 2200  Thu Sep 05, 2019  2110 Hemoglobin: 14.0 [MV]    Clinical Course User Index [MV] Tanda Rockers, [place]   MDM Rules/Calculators/A&P                          34 year old female who presents to the ED today with complaint of right-sided abdominal pain and vaginal bleeding for the past 5 days. History of same and was evaluated in the ED last year with an ultrasound which showed a cyst with some pelvic fluid. She reports she was told her cyst had ruptured. Patient was in prison for the past several months and recently was released. She began her OCPs on June 9 and skipped the placebo's at the end and went straight to another pack. She began bleeding 4 days after starting the new pack. She states this does not feel like typical menstrual cramps and vaginal bleeding. On arrival to the ED patient is afebrile, nontachycardic and nontachypneic. She is hemodynamically stable. Blood pressure 140/90. There is to be in no acute distress. She has some soreness to the right lower quadrant and suprapubic areas. No rebound or guarding. Doubt acute abdomen at this time. Will obtain CBC to check hemoglobin and plan for pelvic exam.   Exam performed with moderate amount of blood in vault. No hemorrhaging noted from os. Prep without any acute findings at this time.   CBC with a stable hemoglobin of 14.0. Remainder of lab work unremarkable. Urinalysis does have hemoglobin consistent with bleeding. Do not have ultrasound at this time of night however I do not feel patient needs an ultrasound at this time. She has no obvious adnexal tenderness on exam. I suspect this may be related to normal menstrual period after restarting OCPs last month. Will give information for OB/GYN for patient to follow-up with. Have advised on ibuprofen and Tylenol as  needed for pain. Have instructed that patient return to the ED for any worsening symptoms including  other signs of anemia. She is in agreement with plan is stable for discharge home.   This note was prepared using Dragon voice recognition software and may include unintentional dictation errors due to the inherent limitations of voice recognition software.  Final Clinical Impression(s) / ED Diagnoses Final diagnoses:  Abnormal uterine bleeding    Rx / DC Orders ED Discharge Orders    None       Discharge Instructions     Please follow up with [facility] located at: [address] [place], [place] [address] [phone]  You can also follow up with your regular [facility]  Continue taking your birth control as prescribed. Use 800 mg Ibuprofen ever 8 hours and 1,000 mg Tylenol every 8 hours as needed for pain.   Return to the ED IMMEDIATELY for any worsening symptoms including worsening pain, excessive bleeding that lasts longer than 9-10 days, dizziness, lightheadedness, vision changes, chest pain, shortness of breath, passing out, or any other new/concerning symptoms.        Tanda Rockers, PA-C 09/05/19 2202    Virgina Norfolk, DO 09/05/19 2239

## 2019-09-06 LAB — HIV ANTIBODY (ROUTINE TESTING W REFLEX): HIV Screen 4th Generation wRfx: NONREACTIVE

## 2019-09-06 LAB — GC/CHLAMYDIA PROBE AMP (~~LOC~~) NOT AT ARMC
Chlamydia: NEGATIVE
Comment: NEGATIVE
Comment: NORMAL
Neisseria Gonorrhea: NEGATIVE

## 2019-09-06 LAB — RPR: RPR Ser Ql: NONREACTIVE

## 2019-09-08 ENCOUNTER — Encounter (HOSPITAL_BASED_OUTPATIENT_CLINIC_OR_DEPARTMENT_OTHER): Payer: Self-pay | Admitting: Emergency Medicine

## 2019-09-08 ENCOUNTER — Other Ambulatory Visit: Payer: Self-pay

## 2019-09-08 ENCOUNTER — Emergency Department (HOSPITAL_BASED_OUTPATIENT_CLINIC_OR_DEPARTMENT_OTHER)
Admission: EM | Admit: 2019-09-08 | Discharge: 2019-09-08 | Disposition: A | Discharge status: Home / Self Care | Payer: Self-pay | Attending: Emergency Medicine | Admitting: Emergency Medicine

## 2019-09-08 DIAGNOSIS — N939 Abnormal uterine and vaginal bleeding, unspecified: Secondary | ICD-10-CM | POA: Insufficient documentation

## 2019-09-08 LAB — BASIC METABOLIC PANEL
Anion gap: 8 (ref 5–15)
BUN: 12 mg/dL (ref 6–20)
CO2: 24 mmol/L (ref 22–32)
Calcium: 9 mg/dL (ref 8.9–10.3)
Chloride: 105 mmol/L (ref 98–111)
Creatinine, Ser: 0.58 mg/dL (ref 0.44–1.00)
GFR calc Af Amer: >60 mL/min (ref >60)
GFR calc non Af Amer: >60 mL/min (ref >60)
Glucose, Bld: 98 mg/dL (ref 70–99)
Potassium: 3.8 mmol/L (ref 3.5–5.1)
Sodium: 137 mmol/L (ref 135–145)

## 2019-09-08 LAB — CBC
HCT: 42 % (ref 36.0–46.0)
Hemoglobin: 13.6 g/dL (ref 12.0–15.0)
MCH: 30 pg (ref 26.0–34.0)
MCHC: 32.4 g/dL (ref 30.0–36.0)
MCV: 92.5 fL (ref 80.0–100.0)
Platelets: 277 10*3/uL (ref 150–400)
RBC: 4.54 MIL/uL (ref 3.87–5.11)
RDW: 14.4 % (ref 11.5–15.5)
WBC: 9.8 10*3/uL (ref 4.0–10.5)
nRBC: 0 % (ref 0.0–0.2)

## 2019-09-08 LAB — PREGNANCY, URINE: Preg Test, Ur: NEGATIVE

## 2019-09-08 MED ORDER — SODIUM CHLORIDE 0.9 % IV BOLUS
1000.0000 mL | Freq: Once | INTRAVENOUS | Status: AC
  Administered 2019-09-08: 1000 mL via INTRAVENOUS

## 2019-09-08 MED ORDER — KETOROLAC TROMETHAMINE 15 MG/ML IJ SOLN
15.0000 mg | Freq: Once | INTRAMUSCULAR | Status: AC
  Administered 2019-09-08: 15 mg via INTRAVENOUS
  Filled 2019-09-08: qty 1

## 2019-09-08 NOTE — ED Provider Notes (Signed)
[facility]-EMERGENCY DEPT [facility] Emergency Department Provider Note MRN:  [id number]  Arrival date & time: 09/08/19     Chief Complaint   Vaginal Bleeding   History of Present Illness   Adriana Galvan is a 34 y.o. year-old female with a history of ovarian cyst presenting to the ED with chief complaint of vaginal bleeding.  1 week of heavy vaginal bleeding.  Explains that her normal.  Typically happens at the end of the month, she was a bit late to start this round of bleeding, started last week and has been using 4-5 tampons per day.  Was evaluated in the emergency department recently and discharged with referral to GYN.  She continues to have bleeding heavier than normal, starting to feel more and more lightheaded.  Endorsing lower pelvic pain and low back pain similar to menstrual pain but more severe than normal.  She compares this pain to when she had an ovarian cyst few years ago.  Symptoms are moderate, constant, no exacerbating or alleviating factors.  Review of Systems  A complete 10 system review of systems was obtained and all systems are negative except as noted in the HPI and PMH.   Patient's Health History    Past Medical History:  Diagnosis Date  . Anxiety   . Bursitis of left hip   . Bursitis of right hip   . Chronic back pain   . Degenerative disk disease   . Depression   . Fibromyalgia   . Ovarian cyst rupture   . Reflux   . Tachycardia     Past Surgical History:  Procedure Laterality Date  . ADENOIDECTOMY    . KNEE ARTHROSCOPY    . KNEE DISLOCATION SURGERY    . TONSILLECTOMY    . WISDOM TOOTH EXTRACTION      Family History  Problem Relation Age of Onset  . Diabetes Other   . Hypertension Other     Social History   Socioeconomic History  . Marital status: Legally Separated    Spouse name: Not on file  . Number of children: Not on file  . Years of education: Not on file  . Highest education level: Not on file  Occupational History  . Not  on file  Tobacco Use  . Smoking status: Never Smoker  . Smokeless tobacco: Never Used  Vaping Use  . Vaping Use: Never used  Substance and Sexual Activity  . Alcohol use: No  . Drug use: No  . Sexual activity: Not on file  Other Topics Concern  . Not on file  Social History Narrative  . Not on file   Social Determinants of Health   Financial Resource Strain:   . Difficulty of Paying Living Expenses:   Food Insecurity:   . Worried About Programme researcher, broadcasting/film/video in the Last Year:   . Barista in the Last Year:   Transportation Needs:   . Freight forwarder (Medical):   Marland Kitchen Lack of Transportation (Non-Medical):   Physical Activity:   . Days of Exercise per Week:   . Minutes of Exercise per Session:   Stress:   . Feeling of Stress :   Social Connections:   . Frequency of Communication with Friends and Family:   . Frequency of Social Gatherings with Friends and Family:   . Attends Religious Services:   . Active Member of Clubs or Organizations:   . Attends Banker Meetings:   Marland Kitchen Marital Status:  Intimate Partner Violence:   . Fear of Current or Ex-Partner:   . Emotionally Abused:   Marland Kitchen Physically Abused:   . Sexually Abused:      Physical Exam   Vitals:   09/08/19 1720 09/08/19 1730  BP:  110/76  Pulse: 73 70  Resp: 19 18  Temp:    SpO2: 100% 100%    CONSTITUTIONAL: Well-appearing, NAD NEURO:  Alert and oriented x 3, no focal deficits EYES:  eyes equal and reactive ENT/NECK:  no LAD, no JVD CARDIO: Regular rate, well-perfused, normal S1 and S2 PULM:  CTAB no wheezing or rhonchi GI/GU:  normal bowel sounds, non-distended, non-tender MSK/SPINE:  No gross deformities, no edema SKIN:  no rash, atraumatic PSYCH:  Appropriate speech and behavior  *Additional and/or pertinent findings included in MDM below  Diagnostic and Interventional Summary    EKG Interpretation  Date/Time:  Sunday September 08 2019 17:22:00 EDT Ventricular Rate:  75 PR  Interval:    QRS Duration: 91 QT Interval:  391 QTC Calculation: 437 R Axis:   71 Text Interpretation: Sinus rhythm Borderline short PR interval RSR' in V1 or V2, probably normal variant Confirmed by Kennis Carina [phone]) on 09/08/2019 6:25:09 PM      Labs Reviewed  PREGNANCY, URINE  CBC  BASIC METABOLIC PANEL    No orders to display    Medications  sodium chloride 0.9 % bolus 1,000 mL (0 mLs Intravenous Stopped 09/08/19 1822)  ketorolac (TORADOL) 15 MG/ML injection 15 mg (15 mg Intravenous Given 09/08/19 1717)     Procedures  /  Critical Care Procedures  ED Course and Medical Decision Making  I have reviewed the triage vital signs, the nursing notes, and pertinent available records from the EMR.  Listed above are laboratory and imaging tests that I personally ordered, reviewed, and interpreted and then considered in my medical decision making (see below for details).      Continued vaginal bleeding, suspect a heavy and more severe menstrual cycle, ovarian cyst is also possible.  Doubt torsion.  Abdomen is soft and nontender, hemodynamically stable, normal vital signs.  I see no significant benefit to imaging today, will obtain labs to ensure no significant bleeding.  Labs are very reassuring, H&H largely unchanged, patient is feeling well, appropriate for discharge with GI and follow-up.  Elmer Sow. Pilar Plate, MD [facility] Emergency Medicine [facility] [email or web address]  Final Clinical Impressions(s) / ED Diagnoses     ICD-10-CM   1. Vaginal bleeding  N93.9     ED Discharge Orders    None       Discharge Instructions Discussed with and Provided to Patient:     Discharge Instructions     You were evaluated in the Emergency Department and after careful evaluation, we did not find any emergent condition requiring admission or further testing in the hospital.  Your exam/testing today was overall reassuring.  You have not lost a dangerous amount of  blood.  Please continue Tylenol and Motrin at home for discomfort and follow-up with a GYN doctor.  Please return to the Emergency Department if you experience any worsening of your condition.  Thank you for allowing [place] to be a part of your care.        Sabas Sous, MD 09/08/19 [phone]

## 2019-09-08 NOTE — Discharge Instructions (Addendum)
You were evaluated in the Emergency Department and after careful evaluation, we did not find any emergent condition requiring admission or further testing in the hospital.  Your exam/testing today was overall reassuring.  You have not lost a dangerous amount of blood.  Please continue Tylenol and Motrin at home for discomfort and follow-up with a GYN doctor.  Please return to the Emergency Department if you experience any worsening of your condition.  Thank you for allowing [place] to be a part of your care.

## 2019-09-08 NOTE — ED Triage Notes (Signed)
Patient states that she was here on Thursday for the same. She is still bleeding heavily, weak and dizzy. She reports that the pain is worse. She states that she is feeling worse now

## 2019-10-10 ENCOUNTER — Emergency Department (HOSPITAL_BASED_OUTPATIENT_CLINIC_OR_DEPARTMENT_OTHER)
Admission: EM | Admit: 2019-10-10 | Discharge: 2019-10-10 | Disposition: A | Discharge status: Home / Self Care | Payer: Self-pay | Attending: Emergency Medicine | Admitting: Emergency Medicine

## 2019-10-10 ENCOUNTER — Other Ambulatory Visit: Payer: Self-pay

## 2019-10-10 ENCOUNTER — Encounter (HOSPITAL_BASED_OUTPATIENT_CLINIC_OR_DEPARTMENT_OTHER): Payer: Self-pay | Admitting: *Deleted

## 2019-10-10 DIAGNOSIS — Z7951 Long term (current) use of inhaled steroids: Secondary | ICD-10-CM | POA: Insufficient documentation

## 2019-10-10 DIAGNOSIS — Y9289 Other specified places as the place of occurrence of the external cause: Secondary | ICD-10-CM | POA: Insufficient documentation

## 2019-10-10 DIAGNOSIS — Y9301 Activity, walking, marching and hiking: Secondary | ICD-10-CM | POA: Insufficient documentation

## 2019-10-10 DIAGNOSIS — S39012A Strain of muscle, fascia and tendon of lower back, initial encounter: Secondary | ICD-10-CM | POA: Insufficient documentation

## 2019-10-10 DIAGNOSIS — W541XXA Struck by dog, initial encounter: Secondary | ICD-10-CM | POA: Insufficient documentation

## 2019-10-10 DIAGNOSIS — E039 Hypothyroidism, unspecified: Secondary | ICD-10-CM | POA: Insufficient documentation

## 2019-10-10 DIAGNOSIS — R21 Rash and other nonspecific skin eruption: Secondary | ICD-10-CM | POA: Insufficient documentation

## 2019-10-10 DIAGNOSIS — Y998 Other external cause status: Secondary | ICD-10-CM | POA: Insufficient documentation

## 2019-10-10 MED ORDER — METHOCARBAMOL 500 MG PO TABS: 500.0000 mg | ORAL_TABLET | Freq: Two times a day (BID) | ORAL | 0 refills | Status: AC

## 2019-10-10 MED ORDER — TRIAMCINOLONE ACETONIDE 0.1 % EX CREA
1.0000 | TOPICAL_CREAM | Freq: Two times a day (BID) | CUTANEOUS | 0 refills | Status: AC
Start: 2019-10-10 — End: ?

## 2019-10-10 MED ORDER — TRIAMCINOLONE ACETONIDE 0.1 % EX CREA
1.0000 | TOPICAL_CREAM | Freq: Two times a day (BID) | CUTANEOUS | 0 refills | Status: DC
Start: 2019-10-10 — End: 2019-10-10

## 2019-10-10 MED ORDER — METHOCARBAMOL 500 MG PO TABS: 500.0000 mg | ORAL_TABLET | Freq: Two times a day (BID) | ORAL | 0 refills | Status: DC

## 2019-10-10 NOTE — Discharge Instructions (Signed)
Use the triamcinolone cream as directed.  You can continue antihistamines like Benadryl or Zyrtec to help with itching. Take the Robaxin as needed for your muscle pain. Return to the ER if you start to experience worsening rash, lip or tongue swelling, shortness of breath.

## 2019-10-10 NOTE — ED Provider Notes (Signed)
[facility] EMERGENCY DEPARTMENT Provider Note   CSN: [id number] Arrival date & time: 10/10/19  1930     History Chief Complaint  Patient presents with  . Rash    Adriana Galvan is a 34 y.o. female with a past medical history of fibromyalgia, chronic back pain presenting to the ED with a chief complaint of rash and back pain.  States that yesterday she was walking her dog when her dog pulled her and she landed in the grass onto her right side.  Since then she has had muscle pain and an area on her right flank that has gotten more red and itchy.  Denies any specific insect bite.  States that her dog did not bite her.  Has been taking antihistamines with some improvement in itching but continues to have the rash.  States that she missed work because of this.  Denies any lip or tongue swelling, chest pain, fever, changes to gait.  She denies possibility of pregnancy.  HPI     Past Medical History:  Diagnosis Date  . Anxiety   . Bursitis of left hip   . Bursitis of right hip   . Chronic back pain   . Degenerative disk disease   . Depression   . Fibromyalgia   . Ovarian cyst rupture   . Reflux   . Tachycardia     Patient Active Problem List   Diagnosis Date Noted  . Chronic back pain   . Bursitis of right hip   . Bursitis of left hip   . Degenerative disk disease   . ABNORMAL GLANDULAR PAPANICOLAOU SMEAR OF CERVIX 03/15/2010  . HYPERTHYROIDISM 02/05/2010  . DEPRESSION 02/05/2010  . 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 ADENOIDECTOMY    . KNEE ARTHROSCOPY    . KNEE DISLOCATION SURGERY    . TONSILLECTOMY    . WISDOM TOOTH EXTRACTION       OB History    Gravida  0   Para  0   Term  0   Preterm  0   AB  0   Living  0     SAB  0   TAB  0   Ectopic  0   Multiple  0   Live Births              Family History  Problem Relation Age of Onset  . Diabetes Other   . Hypertension Other      Social History   Tobacco Use  . Smoking status: Never Smoker  . Smokeless tobacco: Never Used  Vaping Use  . Vaping Use: Never used  Substance Use Topics  . Alcohol use: No  . Drug use: No    Home Medications Prior to Admission medications   Medication Sig Start Date End Date Taking? Authorizing Provider  acetaminophen (TYLENOL) 500 MG tablet Take 1,000 mg by mouth every 6 (six) hours as needed for moderate pain.    [provider]  albuterol (PROVENTIL HFA;VENTOLIN HFA) 108 (90 Base) MCG/ACT inhaler Inhale 2 puffs into the lungs every 6 (six) hours as needed for wheezing or shortness of breath.    [provider]  baclofen (LIORESAL) 10 MG tablet Take 1 tablet (10 mg total) by mouth 3 (three) times daily. 02/27/18   Arthor Captain, PA-C  diclofenac sodium (VOLTAREN) 1 % GEL Apply to right hand tid Patient not taking: Reported on 02/27/2018 01/21/16   Beverely Pace,  [place], PA-C  ferrous sulfate 325 (65 FE) MG tablet Take 1 tablet (325 mg total) by mouth daily. 09/10/18   Petrucelli, Pleas Koch, PA-C  meloxicam (MOBIC) 15 MG tablet Take 1 tablet (15 mg total) by mouth daily. Take 1 daily with food. 02/27/18   Harris, Abigail, PA-C  Menthol-Camphor (TIGER BALM PAIN RELIEVING LG EX) Apply 1 application topically daily as needed (FOR PAIN).    [provider]  methocarbamol (ROBAXIN) 500 MG tablet Take 1 tablet (500 mg total) by mouth 2 (two) times daily. 10/10/19   Levern Kalka, PA-C  naproxen (NAPROSYN) 500 MG tablet Take 1 tablet (500 mg total) by mouth 2 (two) times daily with a meal. Patient not taking: Reported on 01/21/2016 09/10/15   Horton, Mayer Masker, MD  nitrofurantoin, macrocrystal-monohydrate, (MACROBID) 100 MG capsule Take 1 capsule (100 mg total) by mouth 2 (two) times daily. 09/10/18   Petrucelli, Samantha R, PA-C  oxymetazoline (VICKS SINEX) 0.05 % nasal spray Place 1 spray into both nostrils 2 (two) times daily as needed for congestion.    [provider]  triamcinolone cream (KENALOG) 0.1 % Apply 1 application topically 2 (two) times daily. 10/10/19   Demoni Parmar, PA-C    Allergies    Hydrocodone, Metoclopramide hcl, Azithromycin, Diphenhydramine hcl, Ibuprofen, Penicillins, and Tramadol hcl  Review of Systems   Review of Systems  Constitutional: Negative for chills and fever.  HENT: Negative for facial swelling.   Respiratory: Negative for shortness of breath.   Musculoskeletal: Positive for myalgias. Negative for back pain.  Skin: Positive for rash.  Neurological: Negative for weakness.    Physical Exam Updated Vital Signs BP 131/79 (BP Location: Left Arm)   Pulse 92   Temp 98 F (36.7 C) (Oral)   Resp 18   Ht 5\' 4"  (1.626 m)   Wt 70.3 kg   LMP 10/07/2019   SpO2 100%   BMI 26.61 kg/m   Physical Exam Vitals and nursing note reviewed.  Constitutional:      General: She is not in acute distress.    Appearance: She is well-developed. She is not diaphoretic.  HENT:     Head: Normocephalic and atraumatic.  Eyes:     General: No scleral icterus.    Conjunctiva/sclera: Conjunctivae normal.  Pulmonary:     Effort: Pulmonary effort is normal. No respiratory distress.  Musculoskeletal:        General: No tenderness.     Cervical back: Normal range of motion.     Comments: No midline spinal tenderness present in lumbar, thoracic or cervical spine. No step-off palpated. No visible bruising, edema or temperature change noted. No objective signs of numbness present. No saddle anesthesia. 2+ DP pulses bilaterally. Sensation intact to light touch. Strength 5/5 in bilateral lower extremities.  Skin:    Findings: Rash present.     Comments: Approximately 3 cm circular area of erythema on the right flank area.  No vesicles, induration or fluctuance noted.  Neurological:     Mental Status: She is alert.     ED Results / Procedures / Treatments   Labs (all labs ordered are listed, but only abnormal results are  displayed) Labs Reviewed - No data to display  EKG None  Radiology No results found.  Procedures Procedures (including critical care time)  Medications Ordered in ED Medications - No data to display  ED Course  I have reviewed the triage vital signs and the nursing notes.  Pertinent labs & imaging results  that were available during my care of the patient were reviewed by me and considered in my medical decision making (see chart for details).    MDM Rules/Calculators/A&P                          Pt has a patent airway without stridor and is handling secretions without difficulty; no angioedema. No blisters, no pustules, no warmth, no draining sinus tracts, no superficial abscesses, no bullous impetigo, no vesicles, no desquamation, no target lesions with dusky purpura or a central bulla. Not tender to touch. No concern for superimposed infection. No concern for SJS, TEN, TSS, tick borne illness, syphilis or other life-threatening condition.  Suspect that this could be due to an environmental exposure from her recent fall yesterday.  She denies any head injury or loss of consciousness, she is requesting a muscle relaxer as she feels that this flared up her chronic pain.  Will give steroid cream to help with rash.  We will have her follow-up with PCP and return for worsening symptoms.   Patient is hemodynamically stable, in NAD, and able to ambulate in the ED. Evaluation does not show pathology that would require ongoing emergent intervention or inpatient treatment. I explained the diagnosis to the patient. Pain has been managed and has no complaints prior to discharge. Patient is comfortable with above plan and is stable for discharge at this time. All questions were answered prior to disposition. Strict return precautions for returning to the ED were discussed. Encouraged follow up with PCP.   An After Visit Summary was printed and given to the patient.   Portions of this note were  generated with Scientist, clinical (histocompatibility and immunogenetics). Dictation errors may occur despite best attempts at proofreading.  Final Clinical Impression(s) / ED Diagnoses Final diagnoses:  Rash and nonspecific skin eruption  Strain of lumbar region, initial encounter    Rx / DC Orders ED Discharge Orders         Ordered    triamcinolone cream (KENALOG) 0.1 %  2 times daily     Discontinue  Reprint     10/10/19 2213    methocarbamol (ROBAXIN) 500 MG tablet  2 times daily     Discontinue  Reprint     10/10/19 2213           Dietrich Pates, PA-C 10/10/19 2216    Charlynne Pander, MD 10/10/19 [phone]

## 2019-10-10 NOTE — ED Triage Notes (Signed)
Pt c/o circular rash x 2 days to right flank

## 2019-11-06 ENCOUNTER — Emergency Department (HOSPITAL_BASED_OUTPATIENT_CLINIC_OR_DEPARTMENT_OTHER)
Admission: EM | Admit: 2019-11-06 | Discharge: 2019-11-06 | Disposition: A | Discharge status: Home / Self Care | Payer: Self-pay | Attending: Emergency Medicine | Admitting: Emergency Medicine

## 2019-11-06 ENCOUNTER — Other Ambulatory Visit: Payer: Self-pay

## 2019-11-06 ENCOUNTER — Encounter (HOSPITAL_BASED_OUTPATIENT_CLINIC_OR_DEPARTMENT_OTHER): Payer: Self-pay | Admitting: *Deleted

## 2019-11-06 DIAGNOSIS — Z20822 Contact with and (suspected) exposure to covid-19: Secondary | ICD-10-CM | POA: Insufficient documentation

## 2019-11-06 DIAGNOSIS — Z7951 Long term (current) use of inhaled steroids: Secondary | ICD-10-CM | POA: Insufficient documentation

## 2019-11-06 DIAGNOSIS — E0590 Thyrotoxicosis, unspecified without thyrotoxic crisis or storm: Secondary | ICD-10-CM | POA: Insufficient documentation

## 2019-11-06 DIAGNOSIS — J040 Acute laryngitis: Secondary | ICD-10-CM | POA: Insufficient documentation

## 2019-11-06 DIAGNOSIS — J069 Acute upper respiratory infection, unspecified: Secondary | ICD-10-CM | POA: Insufficient documentation

## 2019-11-06 LAB — SARS CORONAVIRUS 2 BY RT PCR (HOSPITAL ORDER, PERFORMED IN ~~LOC~~ HOSPITAL LAB): SARS Coronavirus 2: NEGATIVE

## 2019-11-06 LAB — GROUP A STREP BY PCR: Group A Strep by PCR: NOT DETECTED

## 2019-11-06 NOTE — Discharge Instructions (Signed)
Please refer to attached instructions. Drink plenty of fluids. Tylenol or ibuprofen for fever and pain. Mucinex DM for cough.

## 2019-11-06 NOTE — ED Provider Notes (Signed)
[facility] EMERGENCY DEPARTMENT Provider Note   CSN: [id number] Arrival date & time: 11/06/19  1316     History Chief Complaint  Patient presents with  . Sore Throat    Adriana Galvan is a 34 y.o. female.  Patient presents with sore throat, ear pressure, sinus congestion, non-productive cough, voice change onset 3 days ago. No documented fevers. Denies chest pain, abdominal pain, N/V/D. She has had several contacts with covid positive family and friends. She reports following appropriate infection control guidelines.  The history is provided by the patient. No language interpreter was used.  Sore Throat This is a new problem. The current episode started more than 2 days ago. The problem occurs constantly. The problem has not changed since onset.Pertinent negatives include no chest pain and no abdominal pain.       Past Medical History:  Diagnosis Date  . Anxiety   . Bursitis of left hip   . Bursitis of right hip   . Chronic back pain   . Degenerative disk disease   . Depression   . Fibromyalgia   . Ovarian cyst rupture   . Reflux   . Tachycardia     Patient Active Problem List   Diagnosis Date Noted  . Chronic back pain   . Bursitis of right hip   . Bursitis of left hip   . Degenerative disk disease   . ABNORMAL GLANDULAR PAPANICOLAOU SMEAR OF CERVIX 03/15/2010  . HYPERTHYROIDISM 02/05/2010  . DEPRESSION 02/05/2010  . 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 ADENOIDECTOMY    . KNEE ARTHROSCOPY    . KNEE DISLOCATION SURGERY    . TONSILLECTOMY    . WISDOM TOOTH EXTRACTION       OB History    Gravida  0   Para  0   Term  0   Preterm  0   AB  0   Living  0     SAB  0   TAB  0   Ectopic  0   Multiple  0   Live Births              Family History  Problem Relation Age of Onset  . Diabetes Other   . Hypertension Other     Social History   Tobacco Use  . Smoking status: Never  Smoker  . Smokeless tobacco: Never Used  Vaping Use  . Vaping Use: Never used  Substance Use Topics  . Alcohol use: No  . Drug use: No    Home Medications Prior to Admission medications   Medication Sig Start Date End Date Taking? Authorizing Provider  acetaminophen (TYLENOL) 500 MG tablet Take 1,000 mg by mouth every 6 (six) hours as needed for moderate pain.    [provider]  albuterol (PROVENTIL HFA;VENTOLIN HFA) 108 (90 Base) MCG/ACT inhaler Inhale 2 puffs into the lungs every 6 (six) hours as needed for wheezing or shortness of breath.    [provider]  baclofen (LIORESAL) 10 MG tablet Take 1 tablet (10 mg total) by mouth 3 (three) times daily. 02/27/18   Arthor Captain, PA-C  diclofenac sodium (VOLTAREN) 1 % GEL Apply to right hand tid Patient not taking: Reported on 02/27/2018 01/21/16   Ivery Quale, PA-C  ferrous sulfate 325 (65 FE) MG tablet Take 1 tablet (325 mg total) by mouth daily. 09/10/18   Petrucelli, Samantha R, PA-C  meloxicam (MOBIC) 15 MG tablet  Take 1 tablet (15 mg total) by mouth daily. Take 1 daily with food. 02/27/18   Harris, Abigail, PA-C  Menthol-Camphor (TIGER BALM PAIN RELIEVING LG EX) Apply 1 application topically daily as needed (FOR PAIN).    [provider]  methocarbamol (ROBAXIN) 500 MG tablet Take 1 tablet (500 mg total) by mouth 2 (two) times daily. 10/10/19   Khatri, Hina, PA-C  naproxen (NAPROSYN) 500 MG tablet Take 1 tablet (500 mg total) by mouth 2 (two) times daily with a meal. Patient not taking: Reported on 01/21/2016 09/10/15   Horton, Mayer Masker, MD  nitrofurantoin, macrocrystal-monohydrate, (MACROBID) 100 MG capsule Take 1 capsule (100 mg total) by mouth 2 (two) times daily. 09/10/18   Petrucelli, Samantha R, PA-C  oxymetazoline (VICKS SINEX) 0.05 % nasal spray Place 1 spray into both nostrils 2 (two) times daily as needed for congestion.    [provider]  triamcinolone cream (KENALOG) 0.1 % Apply 1  application topically 2 (two) times daily. 10/10/19   Khatri, Hina, PA-C    Allergies    Hydrocodone, Metoclopramide hcl, Azithromycin, Diphenhydramine hcl, Ibuprofen, Penicillins, and Tramadol hcl  Review of Systems   Review of Systems  Constitutional: Positive for fatigue. Negative for fever.  HENT: Positive for congestion, ear pain, sore throat and voice change.   Respiratory: Positive for cough.   Cardiovascular: Negative for chest pain.  Gastrointestinal: Negative for abdominal pain.  Skin: Negative for rash.  All other systems reviewed and are negative.   Physical Exam Updated Vital Signs BP 124/86 (BP Location: Right Arm)   Pulse 74   Temp 98.1 F (36.7 C) (Oral)   Resp 16   Ht 5\' 4"  (1.626 m)   Wt 73 kg   LMP 11/06/2019   SpO2 100%   BMI 27.64 kg/m   Physical Exam Vitals and nursing note reviewed.  Constitutional:      General: She is not in acute distress.    Appearance: She is well-developed. She is not ill-appearing.  HENT:     Head: Normocephalic.     Right Ear: Tympanic membrane normal.     Left Ear: Tympanic membrane normal.     Nose: Congestion present.     Mouth/Throat:     Pharynx: No oropharyngeal exudate.  Eyes:     Conjunctiva/sclera: Conjunctivae normal.  Cardiovascular:     Rate and Rhythm: Normal rate and regular rhythm.  Pulmonary:     Effort: Pulmonary effort is normal.     Breath sounds: Normal breath sounds.  Abdominal:     Palpations: Abdomen is soft.  Musculoskeletal:     Cervical back: Neck supple.  Lymphadenopathy:     Cervical: No cervical adenopathy.  Skin:    General: Skin is warm and dry.  Neurological:     Mental Status: She is alert and oriented to person, place, and time.  Psychiatric:        Mood and Affect: Mood normal.     ED Results / Procedures / Treatments   Labs (all labs ordered are listed, but only abnormal results are displayed) Labs Reviewed  SARS CORONAVIRUS 2 BY RT PCR (HOSPITAL ORDER, PERFORMED IN  [facility])  GROUP A STREP BY PCR    EKG None  Radiology No results found.  Procedures Procedures (including critical care time)  Medications Ordered in ED Medications - No data to display  ED Course  I have reviewed the triage vital signs and the nursing notes.  Pertinent labs &  imaging results that were available during my care of the patient were reviewed by me and considered in my medical decision making (see chart for details).    MDM Rules/Calculators/A&P                         Pt symptoms consistent with URI. Strep screen and COVID tests negative.. Pt will be discharged with symptomatic treatment.  Discussed return precautions.  Pt is hemodynamically stable & in NAD prior to discharge.   Final Clinical Impression(s) / ED Diagnoses Final diagnoses:  Viral URI with cough  Acute viral laryngitis    Rx / DC Orders ED Discharge Orders    None       Felicie Morn, NP 11/06/19 1538    Charlynne Pander, MD 11/09/19 [phone]

## 2019-11-06 NOTE — ED Triage Notes (Signed)
C/o sore throat x 3 days.

## 2020-01-10 ENCOUNTER — Emergency Department (HOSPITAL_BASED_OUTPATIENT_CLINIC_OR_DEPARTMENT_OTHER)
Admission: EM | Admit: 2020-01-10 | Discharge: 2020-01-10 | Disposition: A | Discharge status: Home / Self Care | Payer: Self-pay | Attending: Emergency Medicine | Admitting: Emergency Medicine

## 2020-01-10 ENCOUNTER — Emergency Department (HOSPITAL_BASED_OUTPATIENT_CLINIC_OR_DEPARTMENT_OTHER): Payer: Self-pay

## 2020-01-10 ENCOUNTER — Other Ambulatory Visit: Payer: Self-pay

## 2020-01-10 ENCOUNTER — Encounter (HOSPITAL_BASED_OUTPATIENT_CLINIC_OR_DEPARTMENT_OTHER): Payer: Self-pay | Admitting: Emergency Medicine

## 2020-01-10 DIAGNOSIS — Z20822 Contact with and (suspected) exposure to covid-19: Secondary | ICD-10-CM | POA: Insufficient documentation

## 2020-01-10 DIAGNOSIS — J069 Acute upper respiratory infection, unspecified: Secondary | ICD-10-CM | POA: Insufficient documentation

## 2020-01-10 DIAGNOSIS — I10 Essential (primary) hypertension: Secondary | ICD-10-CM | POA: Insufficient documentation

## 2020-01-10 LAB — RESPIRATORY PANEL BY RT PCR (FLU A&B, COVID)
Influenza A by PCR: NEGATIVE
Influenza B by PCR: NEGATIVE
SARS Coronavirus 2 by RT PCR: NEGATIVE

## 2020-01-10 MED ORDER — BENZONATATE 100 MG PO CAPS
100.0000 mg | ORAL_CAPSULE | Freq: Three times a day (TID) | ORAL | 0 refills | Status: AC | PRN
Start: 2020-01-10 — End: ?

## 2020-01-10 NOTE — ED Provider Notes (Signed)
[facility] EMERGENCY DEPARTMENT Provider Note   CSN: [id number] Arrival date & time: 01/10/20  1436     History Chief Complaint  Patient presents with  . Cough  . Weakness    Adriana Galvan is a 34 y.o. female past medical history of fibromyalgia, hypertension, presenting with URI symptoms. Patient states symptoms began on Monday with a tickle in her throat and dry cough.  She developed sore throat, bilateral ear pain, and feeling "swimmy headed."  Sore throat and ear pain has resolved. She states she has rhinorrhea and abnormal taste though no loss of taste.  Treating her symptoms with Mucinex.  Her significant other has similar symptoms and is being seen by his provider.  She states she had a Covid exposure 1 month ago, however no known exposure since then.  Has not received Covid vaccines.  The history is provided by the patient.       Past Medical History:  Diagnosis Date  . Anxiety   . Bursitis of left hip   . Bursitis of right hip   . Chronic back pain   . Degenerative disk disease   . Depression   . Fibromyalgia   . Ovarian cyst rupture   . Reflux   . Tachycardia     Patient Active Problem List   Diagnosis Date Noted  . Chronic back pain   . Bursitis of right hip   . Bursitis of left hip   . Degenerative disk disease   . ABNORMAL GLANDULAR PAPANICOLAOU SMEAR OF CERVIX 03/15/2010  . HYPERTHYROIDISM 02/05/2010  . DEPRESSION 02/05/2010  . 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 ADENOIDECTOMY    . KNEE ARTHROSCOPY    . KNEE DISLOCATION SURGERY    . TONSILLECTOMY    . WISDOM TOOTH EXTRACTION       OB History    Gravida  0   Para  0   Term  0   Preterm  0   AB  0   Living  0     SAB  0   TAB  0   Ectopic  0   Multiple  0   Live Births              Family History  Problem Relation Age of Onset  . Diabetes Other   . Hypertension Other     Social History   Tobacco Use  .  Smoking status: Never Smoker  . Smokeless tobacco: Never Used  Vaping Use  . Vaping Use: Never used  Substance Use Topics  . Alcohol use: No  . Drug use: No    Home Medications Prior to Admission medications   Medication Sig Start Date End Date Taking? Authorizing Provider  acetaminophen (TYLENOL) 500 MG tablet Take 1,000 mg by mouth every 6 (six) hours as needed for moderate pain.    [provider]  albuterol (PROVENTIL HFA;VENTOLIN HFA) 108 (90 Base) MCG/ACT inhaler Inhale 2 puffs into the lungs every 6 (six) hours as needed for wheezing or shortness of breath.    [provider]  baclofen (LIORESAL) 10 MG tablet Take 1 tablet (10 mg total) by mouth 3 (three) times daily. 02/27/18   Harris, Abigail, PA-C  benzonatate (TESSALON) 100 MG capsule Take 1 capsule (100 mg total) by mouth 3 (three) times daily as needed for cough. 01/10/20   Meko Masterson, [place], PA-C  diclofenac sodium (VOLTAREN) 1 % GEL Apply to  right hand tid Patient not taking: Reported on 02/27/2018 01/21/16   Ivery Quale, PA-C  ferrous sulfate 325 (65 FE) MG tablet Take 1 tablet (325 mg total) by mouth daily. 09/10/18   Petrucelli, Pleas Koch, PA-C  meloxicam (MOBIC) 15 MG tablet Take 1 tablet (15 mg total) by mouth daily. Take 1 daily with food. 02/27/18   Harris, Abigail, PA-C  Menthol-Camphor (TIGER BALM PAIN RELIEVING LG EX) Apply 1 application topically daily as needed (FOR PAIN).    [provider]  methocarbamol (ROBAXIN) 500 MG tablet Take 1 tablet (500 mg total) by mouth 2 (two) times daily. 10/10/19   Khatri, Hina, PA-C  naproxen (NAPROSYN) 500 MG tablet Take 1 tablet (500 mg total) by mouth 2 (two) times daily with a meal. Patient not taking: Reported on 01/21/2016 09/10/15   Horton, Mayer Masker, MD  nitrofurantoin, macrocrystal-monohydrate, (MACROBID) 100 MG capsule Take 1 capsule (100 mg total) by mouth 2 (two) times daily. 09/10/18   Petrucelli, Samantha R, PA-C  oxymetazoline (VICKS  SINEX) 0.05 % nasal spray Place 1 spray into both nostrils 2 (two) times daily as needed for congestion.    [provider]  triamcinolone cream (KENALOG) 0.1 % Apply 1 application topically 2 (two) times daily. 10/10/19   Khatri, Hina, PA-C    Allergies    Hydrocodone, Metoclopramide hcl, Azithromycin, Diphenhydramine hcl, Ibuprofen, Penicillins, and Tramadol hcl  Review of Systems   Review of Systems  Constitutional: Positive for appetite change. Negative for fever.  HENT: Positive for rhinorrhea and sore throat.   Eyes: Positive for pain.  Respiratory: Positive for cough. Negative for shortness of breath.   Gastrointestinal: Negative for vomiting.    Physical Exam Updated Vital Signs BP 104/74 (BP Location: Left Arm)   Pulse 77   Temp 98.3 F (36.8 C) (Oral)   Resp 12   Ht 5\' 3"  (1.6 m)   Wt 71.7 kg   LMP 12/30/2019   SpO2 100%   BMI 27.99 kg/m   Physical Exam Vitals and nursing note reviewed.  Constitutional:      General: She is not in acute distress.    Appearance: She is well-developed. She is not ill-appearing.  HENT:     Head: Normocephalic and atraumatic.     Right Ear: Tympanic membrane and ear canal normal.     Left Ear: Tympanic membrane and ear canal normal.     Mouth/Throat:     Mouth: Mucous membranes are moist.     Pharynx: Oropharynx is clear. No oropharyngeal exudate or posterior oropharyngeal erythema.  Eyes:     Conjunctiva/sclera: Conjunctivae normal.  Cardiovascular:     Rate and Rhythm: Normal rate and regular rhythm.  Pulmonary:     Effort: Pulmonary effort is normal. No respiratory distress.     Breath sounds: Normal breath sounds.  Abdominal:     General: Bowel sounds are normal.     Tenderness: There is no abdominal tenderness.  Musculoskeletal:     Cervical back: Normal range of motion and neck supple. No tenderness.  Lymphadenopathy:     Cervical: No cervical adenopathy.  Neurological:     Mental Status: She is alert.    Psychiatric:        Mood and Affect: Mood normal.        Behavior: Behavior normal.     ED Results / Procedures / Treatments   Labs (all labs ordered are listed, but only abnormal results are displayed) Labs Reviewed  RESPIRATORY PANEL BY  RT PCR (FLU A&B, COVID)    EKG None  Radiology DG Chest Port 1 View  Result Date: 01/10/2020 CLINICAL DATA:  COVID symptoms.  Cough. EXAM: PORTABLE CHEST 1 VIEW COMPARISON:  09/04/2010 FINDINGS: Midline trachea. Normal heart size and mediastinal contours. No pleural effusion or pneumothorax. Clear lungs. IMPRESSION: Normal chest. Electronically Signed   By: Jeronimo Greaves M.D.   On: 01/10/2020 16:47    Procedures Procedures (including critical care time)  Medications Ordered in ED Medications - No data to display  ED Course  I have reviewed the triage vital signs and the nursing notes.  Pertinent labs & imaging results that were available during my care of the patient were reviewed by me and considered in my medical decision making (see chart for details).    MDM Rules/Calculators/A&P                          DELAINY MCELHINEY was evaluated in Emergency Department on 01/10/2020 for the symptoms described in the history of present illness. She was evaluated in the context of the global COVID-19 pandemic, which necessitated consideration that the patient might be at risk for infection with the SARS-CoV-2 virus that causes COVID-19. Institutional protocols and algorithms that pertain to the evaluation of patients at risk for COVID-19 are in a state of rapid change based on information released by regulatory bodies including the CDC and federal and state organizations. These policies and algorithms were followed during the patient's care in the ED.   Patients symptoms are consistent with URI, likely viral etiology. Afebrile, tolerating secretions.  Lungs clear to auscultation bilaterally. CXR negative for acute infiltrate.  COVID and influenza PCR  is negative. Discussed that antibiotics are not indicated for viral infections.Home isolation precautions discussed. Pt will be discharged with symptomatic treatment.  Verbalizes understanding and is agreeable with plan. Pt is hemodynamically stable & in NAD prior to dc.  Final Clinical Impression(s) / ED Diagnoses Final diagnoses:  Viral URI with cough    Rx / DC Orders ED Discharge Orders         Ordered    benzonatate (TESSALON) 100 MG capsule  3 times daily PRN        01/10/20 1700           Monteen Toops, [place] N, [place] 01/10/20 1701    Terrilee Files, MD 01/11/20 1022

## 2020-01-10 NOTE — ED Triage Notes (Signed)
Pt here for COVID like symptoms, loss of taste 2 days. Pt has been exposed at work.

## 2020-01-10 NOTE — Discharge Instructions (Addendum)
Your chest x-ray is normal. You can alternate Tylenol/acetaminophen and Advil/ibuprofen/Motrin every 4 hours for sore throat, body aches, headache or fever.  Drink plenty of water.  Use saline nasal spray for congestion. You can take Tessalon every 8 hours as needed for cough. Wash your hands frequently. Stay home until your fever has resolved/your symptoms are improving. Follow up with your primary care provider if symptoms persist. Return to the ER for inability to swallow liquids, difficulty breathing, or new or worsening symptoms.

## 2020-05-11 ENCOUNTER — Emergency Department (HOSPITAL_BASED_OUTPATIENT_CLINIC_OR_DEPARTMENT_OTHER)
Admission: EM | Admit: 2020-05-11 | Discharge: 2020-05-11 | Disposition: A | Discharge status: Home / Self Care | Payer: Medicaid Other | Attending: Emergency Medicine | Admitting: Emergency Medicine

## 2020-05-11 ENCOUNTER — Other Ambulatory Visit: Payer: Self-pay

## 2020-05-11 DIAGNOSIS — N63 Unspecified lump in unspecified breast: Secondary | ICD-10-CM

## 2020-05-11 DIAGNOSIS — N6320 Unspecified lump in the left breast, unspecified quadrant: Secondary | ICD-10-CM | POA: Insufficient documentation

## 2020-05-11 LAB — PREGNANCY, URINE: Preg Test, Ur: NEGATIVE

## 2020-05-11 NOTE — ED Provider Notes (Signed)
[facility] EMERGENCY DEPARTMENT Provider Note   CSN: [id number] Arrival date & time: 05/11/20  4166     History Chief Complaint  Patient presents with  . Breast Mass    Left found in left breast today @ 0630    Adriana Galvan is a 35 y.o. female.  HPI      36yo female with history of degenerative disc disease, chronic back pain, fibromyalgia, depression, hyperthyroidism, presents with concern for left breast mass. Woke up this AM and felt it around 630AM.  Tender area. Denies trauma, fevers. Has fam hx of breast cancer in extended family, grandmother, great-aunt. Breasts have been burning over the last month bilaterally.   Past Medical History:  Diagnosis Date  . Anxiety   . Bursitis of left hip   . Bursitis of right hip   . Chronic back pain   . Degenerative disk disease   . Depression   . Fibromyalgia   . Ovarian cyst rupture   . Reflux   . Tachycardia     Patient Active Problem List   Diagnosis Date Noted  . Chronic back pain   . Bursitis of right hip   . Bursitis of left hip   . Degenerative disk disease   . ABNORMAL GLANDULAR PAPANICOLAOU SMEAR OF CERVIX 03/15/2010  . HYPERTHYROIDISM 02/05/2010  . DEPRESSION 02/05/2010  . 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 ADENOIDECTOMY    . KNEE ARTHROSCOPY    . KNEE DISLOCATION SURGERY    . TONSILLECTOMY    . WISDOM TOOTH EXTRACTION       OB History    Gravida  0   Para  0   Term  0   Preterm  0   AB  0   Living  0     SAB  0   IAB  0   Ectopic  0   Multiple  0   Live Births              Family History  Problem Relation Age of Onset  . Diabetes Other   . Hypertension Other     Social History   Tobacco Use  . Smoking status: Never Smoker  . Smokeless tobacco: Never Used  Vaping Use  . Vaping Use: Never used  Substance Use Topics  . Alcohol use: No  . Drug use: No    Home Medications Prior to Admission medications    Medication Sig Start Date End Date Taking? Authorizing Provider  meloxicam (MOBIC) 15 MG tablet Take 1 tablet (15 mg total) by mouth daily. Take 1 daily with food. 02/27/18  Yes Harris, Abigail, PA-C  methocarbamol (ROBAXIN) 500 MG tablet Take 1 tablet (500 mg total) by mouth 2 (two) times daily. 10/10/19  Yes Khatri, Hina, PA-C  acetaminophen (TYLENOL) 500 MG tablet Take 1,000 mg by mouth every 6 (six) hours as needed for moderate pain.    [provider]  albuterol (PROVENTIL HFA;VENTOLIN HFA) 108 (90 Base) MCG/ACT inhaler Inhale 2 puffs into the lungs every 6 (six) hours as needed for wheezing or shortness of breath.    [provider]  baclofen (LIORESAL) 10 MG tablet Take 1 tablet (10 mg total) by mouth 3 (three) times daily. 02/27/18   Harris, Abigail, PA-C  benzonatate (TESSALON) 100 MG capsule Take 1 capsule (100 mg total) by mouth 3 (three) times daily as needed for cough. 01/10/20   Robinson, [place] N, PA-C  diclofenac sodium (VOLTAREN) 1 % GEL Apply to right hand tid Patient not taking: Reported on 02/27/2018 01/21/16   Ivery Quale, PA-C  ferrous sulfate 325 (65 FE) MG tablet Take 1 tablet (325 mg total) by mouth daily. 09/10/18   Petrucelli, Samantha R, PA-C  Menthol-Camphor (TIGER BALM PAIN RELIEVING LG EX) Apply 1 application topically daily as needed (FOR PAIN).    [provider]  naproxen (NAPROSYN) 500 MG tablet Take 1 tablet (500 mg total) by mouth 2 (two) times daily with a meal. Patient not taking: Reported on 01/21/2016 09/10/15   Horton, Mayer Masker, MD  nitrofurantoin, macrocrystal-monohydrate, (MACROBID) 100 MG capsule Take 1 capsule (100 mg total) by mouth 2 (two) times daily. 09/10/18   Petrucelli, Samantha R, PA-C  oxymetazoline (VICKS SINEX) 0.05 % nasal spray Place 1 spray into both nostrils 2 (two) times daily as needed for congestion.    [provider]  triamcinolone cream (KENALOG) 0.1 % Apply 1 application topically 2 (two) times  daily. 10/10/19   Khatri, Hina, PA-C    Allergies    Hydrocodone, Metoclopramide hcl, Azithromycin, Penicillins, and Tramadol hcl  Review of Systems   Review of Systems  Constitutional: Negative for fever.  Respiratory: Negative for cough.   Cardiovascular: Chest pain: breast pain.  Gastrointestinal: Negative for vomiting.  Skin: Negative for wound.    Physical Exam Updated Vital Signs BP (!) 128/102 (BP Location: Right Arm)   Pulse 85   Temp 98.6 F (37 C) (Oral)   Resp 14   Ht 5\' 4"  (1.626 m)   Wt 68 kg   SpO2 99%   BMI 25.75 kg/m   Physical Exam Vitals and nursing note reviewed.  Constitutional:      General: She is not in acute distress.    Appearance: Normal appearance. She is not ill-appearing, toxic-appearing or diaphoretic.  HENT:     Head: Normocephalic.  Eyes:     Conjunctiva/sclera: Conjunctivae normal.  Cardiovascular:     Rate and Rhythm: Normal rate and regular rhythm.     Pulses: Normal pulses.  Pulmonary:     Effort: Pulmonary effort is normal. No respiratory distress.  Musculoskeletal:        General: No deformity or signs of injury.     Cervical back: No rigidity.     Comments: Left breast with tender mobile 4x3cm mass at 5:00  Skin:    General: Skin is warm and dry.     Coloration: Skin is not jaundiced or pale.  Neurological:     General: No focal deficit present.     Mental Status: She is alert and oriented to person, place, and time.     ED Results / Procedures / Treatments   Labs (all labs ordered are listed, but only abnormal results are displayed) Labs Reviewed  PREGNANCY, URINE    EKG None  Radiology No results found.  Procedures Procedures   Medications Ordered in ED Medications - No data to display  ED Course  I have reviewed the triage vital signs and the nursing notes.  Pertinent labs & imaging results that were available during my care of the patient were reviewed by me and considered in my medical decision  making (see chart for details).    MDM Rules/Calculators/A&P                          34yo female with history of degenerative disc disease, chronic back pain, fibromyalgia, depression,  hyperthyroidism, presents with concern for left breast mass.  Clinically does not appear consistent with abscess. Contacted breast care enter who requires PCP referral to center. Discussed this with patient.  She will call PCP tomorrow for referral to breast imaging center.    Final Clinical Impression(s) / ED Diagnoses Final diagnoses:  Breast mass    Rx / DC Orders ED Discharge Orders         Ordered    Ambulatory referral to Breast Clinic        05/11/20 0814    Consult to Transition of Care Team       Provider:  (Not yet assigned)   05/11/20 2951           Alvira Monday, MD 05/12/20 [phone]

## 2020-06-08 ENCOUNTER — Encounter (HOSPITAL_COMMUNITY): Payer: Self-pay

## 2020-06-08 ENCOUNTER — Other Ambulatory Visit: Payer: Self-pay

## 2020-06-08 ENCOUNTER — Emergency Department (HOSPITAL_COMMUNITY)
Admission: EM | Admit: 2020-06-08 | Discharge: 2020-06-08 | Disposition: A | Discharge status: Home / Self Care | Payer: Self-pay | Attending: Emergency Medicine | Admitting: Emergency Medicine

## 2020-06-08 ENCOUNTER — Telehealth: Payer: Self-pay

## 2020-06-08 ENCOUNTER — Emergency Department (HOSPITAL_COMMUNITY): Payer: Self-pay

## 2020-06-08 DIAGNOSIS — K529 Noninfective gastroenteritis and colitis, unspecified: Secondary | ICD-10-CM | POA: Insufficient documentation

## 2020-06-08 DIAGNOSIS — K5900 Constipation, unspecified: Secondary | ICD-10-CM | POA: Insufficient documentation

## 2020-06-08 LAB — URINALYSIS, ROUTINE W REFLEX MICROSCOPIC
Bilirubin Urine: NEGATIVE
Glucose, UA: NEGATIVE mg/dL
Hgb urine dipstick: NEGATIVE
Ketones, ur: NEGATIVE mg/dL
Leukocytes,Ua: NEGATIVE
Nitrite: NEGATIVE
Protein, ur: NEGATIVE mg/dL
Specific Gravity, Urine: 1.028 (ref 1.005–1.030)
pH: 5 (ref 5.0–8.0)

## 2020-06-08 LAB — I-STAT BETA HCG BLOOD, ED (MC, WL, AP ONLY): I-stat hCG, quantitative: <5 m[IU]/mL (ref <5)

## 2020-06-08 LAB — COMPREHENSIVE METABOLIC PANEL
ALT: 25 U/L (ref 0–44)
AST: 21 U/L (ref 15–41)
Albumin: 3.7 g/dL (ref 3.5–5.0)
Alkaline Phosphatase: 44 U/L (ref 38–126)
Anion gap: 8 (ref 5–15)
BUN: 15 mg/dL (ref 6–20)
CO2: 21 mmol/L — ABNORMAL LOW (ref 22–32)
Calcium: 8.6 mg/dL — ABNORMAL LOW (ref 8.9–10.3)
Chloride: 108 mmol/L (ref 98–111)
Creatinine, Ser: 0.94 mg/dL (ref 0.44–1.00)
GFR, Estimated: >60 mL/min (ref >60)
Glucose, Bld: 115 mg/dL — ABNORMAL HIGH (ref 70–99)
Potassium: 3.7 mmol/L (ref 3.5–5.1)
Sodium: 137 mmol/L (ref 135–145)
Total Bilirubin: 0.5 mg/dL (ref 0.3–1.2)
Total Protein: 6.8 g/dL (ref 6.5–8.1)

## 2020-06-08 LAB — CBC
HCT: 40.6 % (ref 36.0–46.0)
Hemoglobin: 13.4 g/dL (ref 12.0–15.0)
MCH: 29.8 pg (ref 26.0–34.0)
MCHC: 33 g/dL (ref 30.0–36.0)
MCV: 90.2 fL (ref 80.0–100.0)
Platelets: 240 10*3/uL (ref 150–400)
RBC: 4.5 MIL/uL (ref 3.87–5.11)
RDW: 13.2 % (ref 11.5–15.5)
WBC: 11.8 10*3/uL — ABNORMAL HIGH (ref 4.0–10.5)
nRBC: 0 % (ref 0.0–0.2)

## 2020-06-08 LAB — POC OCCULT BLOOD, ED: Fecal Occult Bld: NEGATIVE

## 2020-06-08 LAB — ABO/RH: ABO/RH(D): A NEG

## 2020-06-08 LAB — TYPE AND SCREEN
ABO/RH(D): A NEG
Antibody Screen: NEGATIVE

## 2020-06-08 MED ORDER — ONDANSETRON HCL 4 MG/2ML IJ SOLN
4.0000 mg | Freq: Once | INTRAMUSCULAR | Status: AC
  Administered 2020-06-08: 4 mg via INTRAVENOUS
  Filled 2020-06-08: qty 2

## 2020-06-08 MED ORDER — KETOROLAC TROMETHAMINE 15 MG/ML IJ SOLN
15.0000 mg | Freq: Once | INTRAMUSCULAR | Status: AC
  Administered 2020-06-08: 15 mg via INTRAVENOUS
  Filled 2020-06-08: qty 1

## 2020-06-08 MED ORDER — SODIUM CHLORIDE 0.9 % IV BOLUS
1000.0000 mL | Freq: Once | INTRAVENOUS | Status: AC
  Administered 2020-06-08: 1000 mL via INTRAVENOUS

## 2020-06-08 MED ORDER — ONDANSETRON 4 MG PO TBDP: ORAL_TABLET | ORAL | 0 refills | Status: AC

## 2020-06-08 MED ORDER — AMOXICILLIN-POT CLAVULANATE 875-125 MG PO TABS: 1.0000 | ORAL_TABLET | Freq: Two times a day (BID) | ORAL | 0 refills | Status: AC

## 2020-06-08 MED ORDER — DICYCLOMINE HCL 20 MG PO TABS: 20.0000 mg | ORAL_TABLET | Freq: Two times a day (BID) | ORAL | 0 refills | Status: AC

## 2020-06-08 MED ORDER — MORPHINE SULFATE (PF) 4 MG/ML IV SOLN
4.0000 mg | Freq: Once | INTRAVENOUS | Status: DC
  Filled 2020-06-08: qty 1

## 2020-06-08 MED ORDER — IOHEXOL 300 MG/ML  SOLN
100.0000 mL | Freq: Once | INTRAMUSCULAR | Status: AC | PRN
  Administered 2020-06-08: 100 mL via INTRAVENOUS

## 2020-06-08 NOTE — ED Triage Notes (Signed)
Patient c/o mid abdominal pain, nausea, and black stools x 2 days

## 2020-06-08 NOTE — ED Provider Notes (Signed)
[facility]-EMERGENCY DEPT Provider Note   CSN: [id number] Arrival date & time: 06/08/20  1252     History Chief Complaint  Patient presents with  . Abdominal Pain  . Melena    Adriana Galvan is a 35 y.o. female.  35 yo F with a chief complaints of lower abdominal pain and constipation.  Going on for a couple days.  Is just finishing her menstrual cycle.  Denies any urinary symptoms denies fevers denies vomiting.  Describes the pain is sharp and severe seems to come in waves.  The history is provided by the patient.  Abdominal Pain Pain location:  Suprapubic Pain quality: sharp   Pain severity:  Moderate Onset quality:  Gradual Duration:  3 days Timing:  Intermittent Progression:  Waxing and waning Chronicity:  New Relieved by:  Nothing Worsened by:  Nothing Ineffective treatments:  None tried Associated symptoms: no chest pain, no chills, no dysuria, no fever, no nausea, no shortness of breath and no vomiting        Past Medical History:  Diagnosis Date  . Anxiety   . Bursitis of left hip   . Bursitis of right hip   . Chronic back pain   . Degenerative disk disease   . Depression   . Fibromyalgia   . Ovarian cyst rupture   . Reflux   . Tachycardia     Patient Active Problem List   Diagnosis Date Noted  . Chronic back pain   . Bursitis of right hip   . Bursitis of left hip   . Degenerative disk disease   . ABNORMAL GLANDULAR PAPANICOLAOU SMEAR OF CERVIX 03/15/2010  . HYPERTHYROIDISM 02/05/2010  . DEPRESSION 02/05/2010  . 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 ADENOIDECTOMY    . KNEE ARTHROSCOPY    . KNEE DISLOCATION SURGERY    . TONSILLECTOMY    . WISDOM TOOTH EXTRACTION       OB History    Gravida  0   Para  0   Term  0   Preterm  0   AB  0   Living  0     SAB  0   IAB  0   Ectopic  0   Multiple  0   Live Births              Family History  Problem  Relation Age of Onset  . Diabetes Other   . Hypertension Other   . Hypertension Mother     Social History   Tobacco Use  . Smoking status: Never Smoker  . Smokeless tobacco: Never Used  Vaping Use  . Vaping Use: Never used  Substance Use Topics  . Alcohol use: No  . Drug use: No    Home Medications Prior to Admission medications   Medication Sig Start Date End Date Taking? Authorizing Provider  amoxicillin-clavulanate (AUGMENTIN) 875-125 MG tablet Take 1 tablet by mouth 2 (two) times daily. One po bid x 7 days 06/08/20  Yes Melene Plan, DO  dicyclomine (BENTYL) 20 MG tablet Take 1 tablet (20 mg total) by mouth 2 (two) times daily. 06/08/20  Yes Melene Plan, DO  ondansetron (ZOFRAN ODT) 4 MG disintegrating tablet 4mg  ODT q4 hours prn nausea/vomit 06/08/20  Yes 08/08/20, DO  acetaminophen (TYLENOL) 500 MG tablet Take 1,000 mg by mouth every 6 (six) hours as needed for moderate pain.    [provider]  albuterol (PROVENTIL HFA;VENTOLIN HFA) 108 (90 Base) MCG/ACT inhaler Inhale 2 puffs into the lungs every 6 (six) hours as needed for wheezing or shortness of breath.    [provider]  baclofen (LIORESAL) 10 MG tablet Take 1 tablet (10 mg total) by mouth 3 (three) times daily. 02/27/18   Harris, Abigail, PA-C  benzonatate (TESSALON) 100 MG capsule Take 1 capsule (100 mg total) by mouth 3 (three) times daily as needed for cough. 01/10/20   Robinson, [place] N, PA-C  diclofenac sodium (VOLTAREN) 1 % GEL Apply to right hand tid Patient not taking: Reported on 02/27/2018 01/21/16   Ivery Quale, PA-C  ferrous sulfate 325 (65 FE) MG tablet Take 1 tablet (325 mg total) by mouth daily. 09/10/18   Petrucelli, Pleas Koch, PA-C  meloxicam (MOBIC) 15 MG tablet Take 1 tablet (15 mg total) by mouth daily. Take 1 daily with food. 02/27/18   Harris, Abigail, PA-C  Menthol-Camphor (TIGER BALM PAIN RELIEVING LG EX) Apply 1 application topically daily as needed (FOR PAIN).    [provider]  methocarbamol (ROBAXIN) 500 MG tablet Take 1 tablet (500 mg total) by mouth 2 (two) times daily. 10/10/19   Khatri, Hina, PA-C  naproxen (NAPROSYN) 500 MG tablet Take 1 tablet (500 mg total) by mouth 2 (two) times daily with a meal. Patient not taking: Reported on 01/21/2016 09/10/15   Horton, Mayer Masker, MD  nitrofurantoin, macrocrystal-monohydrate, (MACROBID) 100 MG capsule Take 1 capsule (100 mg total) by mouth 2 (two) times daily. 09/10/18   Petrucelli, Samantha R, PA-C  oxymetazoline (VICKS SINEX) 0.05 % nasal spray Place 1 spray into both nostrils 2 (two) times daily as needed for congestion.    [provider]  triamcinolone cream (KENALOG) 0.1 % Apply 1 application topically 2 (two) times daily. 10/10/19   Khatri, Hina, PA-C    Allergies    Hydrocodone, Metoclopramide hcl, Azithromycin, Penicillins, and Tramadol hcl  Review of Systems   Review of Systems  Constitutional: Negative for chills and fever.  HENT: Negative for congestion and rhinorrhea.   Eyes: Negative for redness and visual disturbance.  Respiratory: Negative for shortness of breath and wheezing.   Cardiovascular: Negative for chest pain and palpitations.  Gastrointestinal: Positive for abdominal pain. Negative for nausea and vomiting.  Genitourinary: Negative for dysuria and urgency.  Musculoskeletal: Negative for arthralgias and myalgias.  Skin: Negative for pallor and wound.  Neurological: Negative for dizziness and headaches.    Physical Exam Updated Vital Signs BP 103/62   Pulse 66   Temp 97.7 F (36.5 C) (Oral)   Resp 17   Ht 5\' 4"  (1.626 m)   Wt 74.8 kg   LMP 06/05/2020   SpO2 100%   BMI 28.32 kg/m   Physical Exam Vitals and nursing note reviewed.  Constitutional:      General: She is not in acute distress.    Appearance: She is well-developed. She is not diaphoretic.  HENT:     Head: Normocephalic and atraumatic.  Eyes:     Pupils: Pupils are equal, round, and reactive  to light.  Cardiovascular:     Rate and Rhythm: Normal rate and regular rhythm.     Heart sounds: No murmur heard. No friction rub. No gallop.   Pulmonary:     Effort: Pulmonary effort is normal.     Breath sounds: No wheezing or rales.  Abdominal:     General: There is no distension.     Palpations: Abdomen is soft.  Tenderness: There is abdominal tenderness.     Comments: Quite exquisitely tender throughout the right-sided abdomen.  Musculoskeletal:        General: No tenderness.     Cervical back: Normal range of motion and neck supple.  Skin:    General: Skin is warm and dry.  Neurological:     Mental Status: She is alert and oriented to person, place, and time.  Psychiatric:        Behavior: Behavior normal.     ED Results / Procedures / Treatments   Labs (all labs ordered are listed, but only abnormal results are displayed) Labs Reviewed  COMPREHENSIVE METABOLIC PANEL - Abnormal; Notable for the following components:      Result Value   CO2 21 (*)    Glucose, Bld 115 (*)    Calcium 8.6 (*)    All other components within normal limits  CBC - Abnormal; Notable for the following components:   WBC 11.8 (*)    All other components within normal limits  URINALYSIS, ROUTINE W REFLEX MICROSCOPIC  POC OCCULT BLOOD, ED  I-STAT BETA HCG BLOOD, ED ([facility], [facility], [facility] ONLY)  TYPE AND SCREEN  ABO/RH    EKG None  Radiology CT ABDOMEN PELVIS W CONTRAST  Result Date: 06/08/2020 CLINICAL DATA:  Right lower quadrant pain for several days EXAM: CT ABDOMEN AND PELVIS WITH CONTRAST TECHNIQUE: Multidetector CT imaging of the abdomen and pelvis was performed using the standard protocol following bolus administration of intravenous contrast. CONTRAST:  OMNIPAQUE IOHEXOL 300 MG/ML  SOLN COMPARISON:  12/03/2012 FINDINGS: Lower chest: No acute abnormality. Hepatobiliary: No focal liver abnormality is seen. No gallstones, gallbladder wall thickening, or biliary dilatation. Pancreas:  Unremarkable. No pancreatic ductal dilatation or surrounding inflammatory changes. Spleen: Normal in size without focal abnormality. Adrenals/Urinary Tract: Adrenal glands are within normal limits. Kidneys demonstrate a normal enhancement pattern bilaterally. No renal calculi or obstructive changes are seen. Bladder is well distended and within normal limits. Stomach/Bowel: Inflammatory changes are noted in the distal colon without evidence of abscess or perforation. The appendix is well visualized without significant inflammatory change. Small bowel and stomach appear unremarkable. Vascular/Lymphatic: No significant vascular findings are present. No enlarged abdominal or pelvic lymph nodes. Reproductive: Uterus and bilateral adnexa are unremarkable. Cervical ring is noted possibly related to contraceptive device. Other: No abdominal wall hernia or abnormality. No abdominopelvic ascites. Musculoskeletal: No acute or significant osseous findings. IMPRESSION: Mild colonic hyperemia in the sigmoid colon with mild inflammatory changes. No abscess or perforation is seen. This likely represents some mild focal colitis. Normal appearing appendix.  No other focal abnormality is noted. Electronically Signed   By: Alcide Clever M.D.   On: 06/08/2020 15:39    Procedures Procedures   Medications Ordered in ED Medications  morphine 4 MG/ML injection 4 mg (4 mg Intravenous Patient Refused/Not Given 06/08/20 1337)  sodium chloride 0.9 % bolus 1,000 mL (0 mLs Intravenous Stopped 06/08/20 1453)  ondansetron (ZOFRAN) injection 4 mg (4 mg Intravenous Given 06/08/20 1336)  ketorolac (TORADOL) 15 MG/ML injection 15 mg (15 mg Intravenous Given 06/08/20 1422)  iohexol (OMNIPAQUE) 300 MG/ML solution 100 mL (100 mLs Intravenous Contrast Given 06/08/20 1447)    ED Course  I have reviewed the triage vital signs and the nursing notes.  Pertinent labs & imaging results that were available during my care of the patient were reviewed  by me and considered in my medical decision making (see chart for details).    MDM  Rules/Calculators/A&P                          35 yo F with a chief complaints of crampy lower abdominal pain and constipation.  Going on for the past few days.  Patient with extreme tenderness on exam.  Leukocytosis.  Will obtain a CT scan.  CT with colitis, will start on abx.  GI follow up.  3:55 PM:  I have discussed the diagnosis/risks/treatment options with the patient and family and believe the pt to be eligible for discharge home to follow-up with PCP. We also discussed returning to the ED immediately if new or worsening sx occur. We discussed the sx which are most concerning (e.g., sudden worsening pain, fever, inability to tolerate by mouth) that necessitate immediate return. Medications administered to the patient during their visit and any new prescriptions provided to the patient are listed below.  Medications given during this visit Medications  morphine 4 MG/ML injection 4 mg (4 mg Intravenous Patient Refused/Not Given 06/08/20 1337)  sodium chloride 0.9 % bolus 1,000 mL (0 mLs Intravenous Stopped 06/08/20 1453)  ondansetron (ZOFRAN) injection 4 mg (4 mg Intravenous Given 06/08/20 1336)  ketorolac (TORADOL) 15 MG/ML injection 15 mg (15 mg Intravenous Given 06/08/20 1422)  iohexol (OMNIPAQUE) 300 MG/ML solution 100 mL (100 mLs Intravenous Contrast Given 06/08/20 1447)     The patient appears reasonably screen and/or stabilized for discharge and I doubt any other medical condition or other [facility] requiring further screening, evaluation, or treatment in the ED at this time prior to discharge.   Final Clinical Impression(s) / ED Diagnoses Final diagnoses:  Colitis    Rx / DC Orders ED Discharge Orders         Ordered    amoxicillin-clavulanate (AUGMENTIN) 875-125 MG tablet  2 times daily        06/08/20 1553    dicyclomine (BENTYL) 20 MG tablet  2 times daily        06/08/20 1553    ondansetron  (ZOFRAN ODT) 4 MG disintegrating tablet        06/08/20 1553           Melene PlanFloyd, Jaanai Salemi, DO 06/08/20 1555

## 2020-06-08 NOTE — ED Notes (Signed)
Noticed pt left belly button ring in room. Called pt and talked to her on her cell number, pt states "please throw it away, I have many at home, I don't need it".

## 2020-06-08 NOTE — Discharge Instructions (Signed)
Return for worsening pain, fever 

## 2020-06-08 NOTE — Telephone Encounter (Signed)
Telephoned patient at mobile number. Left a voice message with [facility] contact information. 

## 2020-06-26 ENCOUNTER — Other Ambulatory Visit: Payer: Self-pay

## 2020-06-26 DIAGNOSIS — N6320 Unspecified lump in the left breast, unspecified quadrant: Secondary | ICD-10-CM

## 2020-07-21 ENCOUNTER — Ambulatory Visit: Payer: Self-pay | Admitting: *Deleted

## 2020-07-21 ENCOUNTER — Other Ambulatory Visit: Payer: Self-pay | Admitting: Obstetrics and Gynecology

## 2020-07-21 ENCOUNTER — Ambulatory Visit
Admission: RE | Admit: 2020-07-21 | Discharge: 2020-07-21 | Disposition: A | Discharge status: Home / Self Care | Payer: No Typology Code available for payment source | Source: Ambulatory Visit | Attending: Obstetrics and Gynecology | Admitting: Obstetrics and Gynecology

## 2020-07-21 ENCOUNTER — Ambulatory Visit
Admission: RE | Admit: 2020-07-21 | Discharge: 2020-07-21 | Disposition: A | Discharge status: Home / Self Care | Payer: Medicaid Other | Source: Ambulatory Visit | Attending: Obstetrics and Gynecology | Admitting: Obstetrics and Gynecology

## 2020-07-21 ENCOUNTER — Other Ambulatory Visit: Payer: Self-pay

## 2020-07-21 VITALS — BP 104/68 | Wt 167.5 lb

## 2020-07-21 DIAGNOSIS — N644 Mastodynia: Secondary | ICD-10-CM

## 2020-07-21 DIAGNOSIS — N6320 Unspecified lump in the left breast, unspecified quadrant: Secondary | ICD-10-CM

## 2020-07-21 DIAGNOSIS — N6323 Unspecified lump in the left breast, lower outer quadrant: Secondary | ICD-10-CM

## 2020-07-21 DIAGNOSIS — Z1239 Encounter for other screening for malignant neoplasm of breast: Secondary | ICD-10-CM

## 2020-07-21 NOTE — Progress Notes (Signed)
Adriana Galvan is a 35 y.o. female who presents to [facility] clinic today with complaint of left breast lump and pain x 2 months. Patient states the pain is within the outer and lower breast. Patient states the pain is intermittent and constant when wearing her bra. Patient rates the pain at a 6 out of 10.    Pap Smear: Pap smear not completed today. Patient thinks her last Pap [facility] was in 2020 at [facility] or [facility] clinic and was normal. Per patient has history of an abnormal Pap smear 15 years ago that a colposcopy was completed for follow-up that was benign. Patient stated all Pap smears have been normal since colposcopy and that she has had at least three normal Pap smears. Last Pap smear result is not available in Epic.   Physical exam: Breasts Breasts symmetrical. No skin abnormalities bilateral breasts. No nipple retraction bilateral breasts. No nipple discharge bilateral breasts. No lymphadenopathy. No lumps palpated right breast. Palpated a lump within the left breast at 5 o'clock 4 cm from the nipple. Complaints of pain when palpated left breast lump and left outer breast on exam.   Pelvic/Bimanual Pap smear is not indicated if last Pap smear was in 2020 per [facility] guidelines.   Smoking History: Patient has never smoked.   Patient Navigation: Patient education provided. Access to services provided for patient through [facility] program.    Breast and Cervical Cancer Risk Assessment: Patient has family history of her maternal grandmother and a maternal great aunt having breast cancer. Patient has no known genetic mutations or history of radiation treatment to the chest before age 60. Per patient has history of cervical dysplasia. Patient has no history of being immunocompromised or DES exposure in-utero. Breast cancer risk assessment completed. No breast cancer risk calculated due to patient is less than 4 years old.  Risk Assessment    Risk  Scores      07/21/2020   Last edited by: Adriana Rutherford, LPN   5-year risk:    Lifetime risk:           A: [facility] exam without pap smear Complaint of left breast lump and pain.  P: Referred patient to the [facility] for a diagnostic mammogram. Appointment scheduled Tuesday, Jul 21, 2020 at 1400.  Adriana Heidelberg, RN 07/21/2020 1:24 PM

## 2020-07-21 NOTE — Patient Instructions (Addendum)
Explained breast self awareness with Aggie Cosier. Patient did not need a Pap smear today due to last Pap smear was in 2020 per patient. Let her know [facility] will cover Pap smears every 3 years unless has a history of abnormal Pap smears. Referred patient to the [facility] for a diagnostic mammogram. Appointment scheduled Tuesday, Jul 21, 2020 at 1400. Patient aware of appointment and will be there. Aggie Cosier verbalized understanding.  Damariz Paganelli, Kathaleen Maser, RN 1:24 PM

## 2020-07-28 ENCOUNTER — Other Ambulatory Visit: Payer: No Typology Code available for payment source

## 2020-07-31 ENCOUNTER — Ambulatory Visit
Admission: RE | Admit: 2020-07-31 | Discharge: 2020-07-31 | Disposition: A | Discharge status: Home / Self Care | Payer: No Typology Code available for payment source | Source: Ambulatory Visit | Attending: Obstetrics and Gynecology | Admitting: Obstetrics and Gynecology

## 2020-07-31 ENCOUNTER — Other Ambulatory Visit: Payer: Self-pay

## 2020-07-31 DIAGNOSIS — N6320 Unspecified lump in the left breast, unspecified quadrant: Secondary | ICD-10-CM

## 2022-06-15 ENCOUNTER — Emergency Department (HOSPITAL_BASED_OUTPATIENT_CLINIC_OR_DEPARTMENT_OTHER)
Admission: EM | Admit: 2022-06-15 | Discharge: 2022-06-15 | Disposition: A | Discharge status: Home / Self Care | Payer: Medicaid Other | Attending: Emergency Medicine | Admitting: Emergency Medicine

## 2022-06-15 ENCOUNTER — Encounter (HOSPITAL_BASED_OUTPATIENT_CLINIC_OR_DEPARTMENT_OTHER): Payer: Self-pay | Admitting: Emergency Medicine

## 2022-06-15 ENCOUNTER — Emergency Department (HOSPITAL_BASED_OUTPATIENT_CLINIC_OR_DEPARTMENT_OTHER): Payer: Medicaid Other

## 2022-06-15 ENCOUNTER — Other Ambulatory Visit: Payer: Self-pay

## 2022-06-15 DIAGNOSIS — M25461 Effusion, right knee: Secondary | ICD-10-CM | POA: Insufficient documentation

## 2022-06-15 DIAGNOSIS — X501XXA Overexertion from prolonged static or awkward postures, initial encounter: Secondary | ICD-10-CM | POA: Insufficient documentation

## 2022-06-15 DIAGNOSIS — M25561 Pain in right knee: Secondary | ICD-10-CM | POA: Diagnosis present

## 2022-06-15 NOTE — ED Triage Notes (Signed)
Pt states has Hx of bilateral knee surgery as a teenager, fell Saturday night injured right knee and is having pain and issues straightening leg.

## 2022-06-15 NOTE — ED Provider Notes (Signed)
[place] [facility] Provider Note   CSN: [id number] Arrival date & time: 06/15/22  2013     History  Chief Complaint  Patient presents with   Knee Injury    Adriana Galvan is a 37 y.o. female who presents emergency department with concerns for right knee injury onset 5 days.  Notes that she was walking when she missed stepped and twisted her right knee.  Has been trying supportive care at home for her symptoms.  Denies hitting her head or LOC.  The history is provided by the patient. No language interpreter was used.       Home Medications Prior to Admission medications   Medication Sig Start Date End Date Taking? Authorizing Provider  acetaminophen (TYLENOL) 500 MG tablet Take 1,000 mg by mouth every 6 (six) hours as needed for moderate pain.    [provider]  albuterol (PROVENTIL HFA;VENTOLIN HFA) 108 (90 Base) MCG/ACT inhaler Inhale 2 puffs into the lungs every 6 (six) hours as needed for wheezing or shortness of breath.    [provider]  amoxicillin-clavulanate (AUGMENTIN) 875-125 MG tablet Take 1 tablet by mouth 2 (two) times daily. One po bid x 7 days 06/08/20   Melene Plan, DO  baclofen (LIORESAL) 10 MG tablet Take 1 tablet (10 mg total) by mouth 3 (three) times daily. 02/27/18   Harris, Abigail, PA-C  benzonatate (TESSALON) 100 MG capsule Take 1 capsule (100 mg total) by mouth 3 (three) times daily as needed for cough. 01/10/20   Robinson, [place] N, PA-C  diclofenac sodium (VOLTAREN) 1 % GEL Apply to right hand tid Patient not taking: Reported on 02/27/2018 01/21/16   Ivery Quale, PA-C  dicyclomine (BENTYL) 20 MG tablet Take 1 tablet (20 mg total) by mouth 2 (two) times daily. 06/08/20   Melene Plan, DO  etonogestrel-ethinyl estradiol (NUVARING) 0.12-0.015 MG/24HR vaginal ring Place 1 each vaginally every 28 (twenty-eight) days. Insert vaginally and leave in place for 3 consecutive weeks, then remove for 1 week.     [provider]  ferrous sulfate 325 (65 FE) MG tablet Take 1 tablet (325 mg total) by mouth daily. 09/10/18   Petrucelli, Samantha R, PA-C  FLUoxetine (PROZAC) 40 MG capsule Take 40 mg by mouth daily.    [provider]  meloxicam (MOBIC) 15 MG tablet Take 1 tablet (15 mg total) by mouth daily. Take 1 daily with food. 02/27/18   Harris, Abigail, PA-C  Menthol-Camphor (TIGER BALM PAIN RELIEVING LG EX) Apply 1 application topically daily as needed (FOR PAIN).    [provider]  methocarbamol (ROBAXIN) 500 MG tablet Take 1 tablet (500 mg total) by mouth 2 (two) times daily. 10/10/19   Khatri, Hina, PA-C  metoprolol tartrate (LOPRESSOR) 25 MG tablet Take 25 mg by mouth 2 (two) times daily.    [provider]  naproxen (NAPROSYN) 500 MG tablet Take 1 tablet (500 mg total) by mouth 2 (two) times daily with a meal. Patient not taking: Reported on 01/21/2016 09/10/15   Horton, Mayer Masker, MD  nitrofurantoin, macrocrystal-monohydrate, (MACROBID) 100 MG capsule Take 1 capsule (100 mg total) by mouth 2 (two) times daily. 09/10/18   Petrucelli, Samantha R, PA-C  ondansetron (ZOFRAN ODT) 4 MG disintegrating tablet 4mg  ODT q4 hours prn nausea/vomit 06/08/20   Melene Plan, DO  oxymetazoline (VICKS SINEX) 0.05 % nasal spray Place 1 spray into both nostrils 2 (two) times daily as needed for congestion.    [provider]  rOPINIRole (REQUIP) 0.25 MG tablet Take 0.25 mg by mouth at bedtime. 2 at bedtime    [provider]  triamcinolone cream (KENALOG) 0.1 % Apply 1 application topically 2 (two) times daily. 10/10/19   Khatri, Hina, PA-C  vitamin B-12 (CYANOCOBALAMIN) 100 MCG tablet Take 100 mcg by mouth daily.    [provider]      Allergies    Hydrocodone, Metoclopramide hcl, Azithromycin, Penicillins, and Tramadol hcl    Review of Systems   Review of Systems  All other systems reviewed and are negative.   Physical Exam Updated Vital Signs BP (!)  138/96 (BP Location: Left Arm)   Pulse 83   Temp 98.8 F (37.1 C) (Oral)   Resp 18   Ht  (1.626 m)   Wt 79.8 kg   LMP 06/13/2022 (Exact Date)   SpO2 99%   BMI 30.21 kg/m  Physical Exam Vitals and nursing note reviewed.  Constitutional:      General: She is not in acute distress.    Appearance: Normal appearance.  Eyes:     General: No scleral icterus.    Extraocular Movements: Extraocular movements intact.  Cardiovascular:     Rate and Rhythm: Normal rate.  Pulmonary:     Effort: Pulmonary effort is normal. No respiratory distress.  Musculoskeletal:     Cervical back: Neck supple.     Comments: Tenderness to palpation noted to the superior, medial, inferior aspect of the right knee without appreciable swelling noted.  Able to flex and extend right knee against resistance without difficulty.  No tenderness to palpation noted to right lower leg or right thigh.  Skin:    General: Skin is warm and dry.     Findings: No bruising, erythema or rash.  Neurological:     Mental Status: She is alert.  Psychiatric:        Behavior: Behavior normal.     ED Results / Procedures / Treatments   Labs (all labs ordered are listed, but only abnormal results are displayed) Labs Reviewed - No data to display  EKG None  Radiology DG Knee Complete 4 Views Right  Result Date: 06/15/2022 CLINICAL DATA:  Fall. Injured right knee. Pain. EXAM: RIGHT KNEE - COMPLETE 4+ VIEW COMPARISON:  None Available. FINDINGS: No acute fracture or dislocation. There may be a slight widening of the medial tibiofemoral joint space. Minimal knee joint effusion. No erosive change. There may be mild anterior soft tissue edema. IMPRESSION: 1. No acute fracture or dislocation. 2. Possible slight widening of the medial tibiofemoral joint space which may represent ligamentous injury. 3. Minimal knee joint effusion and anterior soft tissue edema. Electronically Signed   By: Narda Rutherford M.D.   On: 06/15/2022 21:02     Procedures Procedures    Medications Ordered in ED Medications - No data to display  ED Course/ Medical Decision Making/ A&P Clinical Course as of 06/15/22 2116  Wed Jun 15, 2022  2115 Discussed with patient imaging findings.  Discussed with patient discharge treatment plan.  Answered all verbal questions.  Patient for safe discharge at this time. [SB]    Clinical Course User Index [SB] Angelisa Winthrop A, PA-C                            Medical Decision Making Amount and/or Complexity of Data Reviewed Radiology: ordered.   Patient with right knee pain onset 5 days status post  twisting her knee. Vital signs pt afebrile. On exam, patient with tenderness to palpation noted to the superior, medial, inferior aspect of the right knee without appreciable swelling noted.  Able to flex and extend right knee against resistance without difficulty.  No tenderness to palpation noted to right lower leg or right thigh. Differential diagnosis includes contusion, fracture, dislocation, sprain.    Imaging: I ordered imaging studies including right knee xray I independently visualized and interpreted imaging which showed:  1. No acute fracture or dislocation.  2. Possible slight widening of the medial tibiofemoral joint space  which may represent ligamentous injury.  3. Minimal knee joint effusion and anterior soft tissue edema.   I agree with the radiologist interpretation  Disposition: Presenting suspicious for effusion of the right knee and likely ligamentous injury.  Doubt concerns at this time for fracture, dislocation, sprain.  After consideration of the diagnostic results and the patients response to treatment, I feel that the patient would benefit from Discharge home.  Patient provided with knee brace in the emergency department.  Offered crutches, patient notes that she has crutches at home.  Provided with patient with information for orthopedics for follow-up. Supportive care measures and  strict return precautions discussed with patient at bedside. Pt acknowledges and verbalizes understanding. Pt appears safe for discharge. Follow up as indicated in discharge paperwork.    This chart was dictated using voice recognition software, Dragon. Despite the best efforts of this provider to proofread and correct errors, errors may still occur which can change documentation meaning.   Final Clinical Impression(s) / ED Diagnoses Final diagnoses:  Effusion of right knee  Acute pain of right knee    Rx / DC Orders ED Discharge Orders     None         Rishi Vicario A, PA-C 06/15/22 2207    Glyn Ade, MD 06/17/22 1503

## 2022-06-15 NOTE — Discharge Instructions (Signed)
It was a pleasure taking care of you!   Your x-ray was negative for fracture or dislocation.  It did show concern for ligament injury and effusion to the knee.  You may take over the counter 600 mg Ibuprofen every 6 hours and alternate with 500 mg Tylenol every 6 hours as needed for pain for no more than 7 days.  You will be given a brace today, wear during the day, you may remove it at night.  Ensure that you are elevating your right knee at nighttime.  Attached is information for the orthopedist, call and set up a follow-up appointment regarding today's ED visit.  You may apply ice to affected area for up to 15 minutes at a time. Ensure to place a barrier between your skin and the ice.  You may follow-up with your primary care provider as needed.  Return to the Emergency Department if you are experiencing increasing/worsening symptoms.

## 2022-11-07 ENCOUNTER — Encounter (HOSPITAL_BASED_OUTPATIENT_CLINIC_OR_DEPARTMENT_OTHER): Payer: Self-pay | Admitting: Urology

## 2022-11-07 ENCOUNTER — Emergency Department (HOSPITAL_BASED_OUTPATIENT_CLINIC_OR_DEPARTMENT_OTHER): Payer: Medicaid Other

## 2022-11-07 ENCOUNTER — Emergency Department (HOSPITAL_BASED_OUTPATIENT_CLINIC_OR_DEPARTMENT_OTHER)
Admission: EM | Admit: 2022-11-07 | Discharge: 2022-11-07 | Disposition: A | Discharge status: Home / Self Care | Payer: Medicaid Other

## 2022-11-07 ENCOUNTER — Other Ambulatory Visit: Payer: Self-pay

## 2022-11-07 DIAGNOSIS — J45909 Unspecified asthma, uncomplicated: Secondary | ICD-10-CM | POA: Diagnosis not present

## 2022-11-07 DIAGNOSIS — Z1152 Encounter for screening for COVID-19: Secondary | ICD-10-CM | POA: Insufficient documentation

## 2022-11-07 DIAGNOSIS — R002 Palpitations: Secondary | ICD-10-CM

## 2022-11-07 DIAGNOSIS — J9801 Acute bronchospasm: Secondary | ICD-10-CM | POA: Insufficient documentation

## 2022-11-07 LAB — TROPONIN I (HIGH SENSITIVITY): Troponin I (High Sensitivity): <2 ng/L (ref <18)

## 2022-11-07 LAB — BASIC METABOLIC PANEL
Anion gap: 10 (ref 5–15)
BUN: 8 mg/dL (ref 6–20)
CO2: 25 mmol/L (ref 22–32)
Calcium: 8.4 mg/dL — ABNORMAL LOW (ref 8.9–10.3)
Chloride: 103 mmol/L (ref 98–111)
Creatinine, Ser: 0.8 mg/dL (ref 0.44–1.00)
GFR, Estimated: >60 mL/min (ref >60)
Glucose, Bld: 97 mg/dL (ref 70–99)
Potassium: 3 mmol/L — ABNORMAL LOW (ref 3.5–5.1)
Sodium: 138 mmol/L (ref 135–145)

## 2022-11-07 LAB — RESP PANEL BY RT-PCR (RSV, FLU A&B, COVID)  RVPGX2
Influenza A by PCR: NEGATIVE
Influenza B by PCR: NEGATIVE
Resp Syncytial Virus by PCR: NEGATIVE
SARS Coronavirus 2 by RT PCR: NEGATIVE

## 2022-11-07 LAB — PREGNANCY, URINE: Preg Test, Ur: NEGATIVE

## 2022-11-07 LAB — CBC
HCT: 39.2 % (ref 36.0–46.0)
Hemoglobin: 13.2 g/dL (ref 12.0–15.0)
MCH: 32.8 pg (ref 26.0–34.0)
MCHC: 33.7 g/dL (ref 30.0–36.0)
MCV: 97.5 fL (ref 80.0–100.0)
Platelets: 218 10*3/uL (ref 150–400)
RBC: 4.02 MIL/uL (ref 3.87–5.11)
RDW: 13.8 % (ref 11.5–15.5)
WBC: 10.3 10*3/uL (ref 4.0–10.5)
nRBC: 0 % (ref 0.0–0.2)

## 2022-11-07 LAB — CBG MONITORING, ED: Glucose-Capillary: 105 mg/dL — ABNORMAL HIGH (ref 70–99)

## 2022-11-07 LAB — D-DIMER, QUANTITATIVE: D-Dimer, Quant: 0.55 ug{FEU}/mL — ABNORMAL HIGH (ref 0.00–0.50)

## 2022-11-07 MED ORDER — ALBUTEROL SULFATE HFA 108 (90 BASE) MCG/ACT IN AERS: 1.0000 | INHALATION_SPRAY | Freq: Four times a day (QID) | RESPIRATORY_TRACT | 0 refills | Status: AC | PRN

## 2022-11-07 MED ORDER — IOHEXOL 350 MG/ML SOLN
100.0000 mL | Freq: Once | INTRAVENOUS | Status: AC | PRN
  Administered 2022-11-07: 75 mL via INTRAVENOUS

## 2022-11-07 MED ORDER — IOHEXOL 350 MG/ML SOLN: 100.0000 mL | Freq: Once | INTRAVENOUS | Status: DC | PRN

## 2022-11-07 MED ORDER — POTASSIUM CHLORIDE CRYS ER 20 MEQ PO TBCR
40.0000 meq | EXTENDED_RELEASE_TABLET | Freq: Once | ORAL | Status: AC
  Administered 2022-11-07: 40 meq via ORAL
  Filled 2022-11-07: qty 2

## 2022-11-07 MED ORDER — IPRATROPIUM-ALBUTEROL 0.5-2.5 (3) MG/3ML IN SOLN
3.0000 mL | Freq: Once | RESPIRATORY_TRACT | Status: AC
  Administered 2022-11-07: 3 mL via RESPIRATORY_TRACT
  Filled 2022-11-07: qty 3

## 2022-11-07 MED ORDER — PREDNISONE 50 MG PO TABS: ORAL_TABLET | ORAL | 0 refills | Status: AC

## 2022-11-07 NOTE — ED Provider Notes (Signed)
[place] [facility] Provider Note   CSN: [id number] Arrival date & time: 11/07/22  1035     History  Chief Complaint  Patient presents with   Cough   Palpitations    DORELLA MIHALOVICH is a 37 y.o. female.  37 year old female with past medical history of anxiety and asthma as a child presents emergency department today with chest tightness and palpitations.  Patient states has been going now for the past few days.  States that she does have occasional pleuritic chest pain.  She denies any hemoptysis.  She states that she is having some shortness of breath with this.  She had an appointment scheduled with her new primary care provider today but reports that when she got there she was told that her primary care provider had COVID and was out of the office.  The patient states that she was on metoprolol for years for "palpitations".  She states that her doctor recently discontinued this 2 months ago and since then she has been having palpitations intermittently.  The patient is on OCPs.  She denies a history of DVT or pulmonary embolism, recent surgeries, recent travel.  She denies any leg pain or swelling.  She came to the emergency department today due to these ongoing symptoms.   Cough Palpitations Associated symptoms: cough        Home Medications Prior to Admission medications   Medication Sig Start Date End Date Taking? Authorizing Provider  albuterol (VENTOLIN HFA) 108 (90 Base) MCG/ACT inhaler Inhale 1-2 puffs into the lungs every 6 (six) hours as needed for wheezing or shortness of breath. 11/07/22  Yes Durwin Glaze, MD  predniSONE (DELTASONE) 50 MG tablet Take 1 tablet by mouth daily 11/07/22  Yes Durwin Glaze, MD  acetaminophen (TYLENOL) 500 MG tablet Take 1,000 mg by mouth every 6 (six) hours as needed for moderate pain.    [provider]  albuterol (PROVENTIL HFA;VENTOLIN HFA) 108 (90 Base) MCG/ACT inhaler Inhale 2 puffs into the  lungs every 6 (six) hours as needed for wheezing or shortness of breath.    [provider]  amoxicillin-clavulanate (AUGMENTIN) 875-125 MG tablet Take 1 tablet by mouth 2 (two) times daily. One po bid x 7 days 06/08/20   Melene Plan, DO  baclofen (LIORESAL) 10 MG tablet Take 1 tablet (10 mg total) by mouth 3 (three) times daily. 02/27/18   Harris, Abigail, PA-C  benzonatate (TESSALON) 100 MG capsule Take 1 capsule (100 mg total) by mouth 3 (three) times daily as needed for cough. 01/10/20   Robinson, [place] N, PA-C  diclofenac sodium (VOLTAREN) 1 % GEL Apply to right hand tid Patient not taking: Reported on 02/27/2018 01/21/16   Ivery Quale, PA-C  dicyclomine (BENTYL) 20 MG tablet Take 1 tablet (20 mg total) by mouth 2 (two) times daily. 06/08/20   Melene Plan, DO  etonogestrel-ethinyl estradiol (NUVARING) 0.12-0.015 MG/24HR vaginal ring Place 1 each vaginally every 28 (twenty-eight) days. Insert vaginally and leave in place for 3 consecutive weeks, then remove for 1 week.    [provider]  ferrous sulfate 325 (65 FE) MG tablet Take 1 tablet (325 mg total) by mouth daily. 09/10/18   Petrucelli, Samantha R, PA-C  FLUoxetine (PROZAC) 40 MG capsule Take 40 mg by mouth daily.    [provider]  meloxicam (MOBIC) 15 MG tablet Take 1 tablet (15 mg total) by mouth daily. Take 1 daily with food. 02/27/18   Tiburcio Pea,  Abigail, PA-C  Menthol-Camphor (TIGER BALM PAIN RELIEVING LG EX) Apply 1 application topically daily as needed (FOR PAIN).    [provider]  methocarbamol (ROBAXIN) 500 MG tablet Take 1 tablet (500 mg total) by mouth 2 (two) times daily. 10/10/19   Khatri, Hina, PA-C  metoprolol tartrate (LOPRESSOR) 25 MG tablet Take 25 mg by mouth 2 (two) times daily.    [provider]  naproxen (NAPROSYN) 500 MG tablet Take 1 tablet (500 mg total) by mouth 2 (two) times daily with a meal. Patient not taking: Reported on 01/21/2016 09/10/15   Horton, Mayer Masker, MD   nitrofurantoin, macrocrystal-monohydrate, (MACROBID) 100 MG capsule Take 1 capsule (100 mg total) by mouth 2 (two) times daily. 09/10/18   Petrucelli, Pleas Koch, PA-C  ondansetron (ZOFRAN ODT) 4 MG disintegrating tablet 4mg  ODT q4 hours prn nausea/vomit 06/08/20   Melene Plan, DO  oxymetazoline (VICKS SINEX) 0.05 % nasal spray Place 1 spray into both nostrils 2 (two) times daily as needed for congestion.    [provider]  rOPINIRole (REQUIP) 0.25 MG tablet Take 0.25 mg by mouth at bedtime. 2 at bedtime    [provider]  triamcinolone cream (KENALOG) 0.1 % Apply 1 application topically 2 (two) times daily. 10/10/19   Khatri, Hina, PA-C  vitamin B-12 (CYANOCOBALAMIN) 100 MCG tablet Take 100 mcg by mouth daily.    [provider]      Allergies    Hydrocodone, Metoclopramide hcl, Azithromycin, Penicillins, and Tramadol hcl    Review of Systems   Review of Systems  Respiratory:  Positive for cough.   Cardiovascular:  Positive for palpitations.  All other systems reviewed and are negative.   Physical Exam Updated Vital Signs BP 102/68   Pulse 78   Temp 98 F (36.7 C)   Resp 18   Ht 5\' 4"  (1.626 m)   Wt 80.7 kg   LMP 11/05/2022   SpO2 99%   BMI 30.55 kg/m  Physical Exam Vitals and nursing note reviewed.   Gen: NAD Eyes: PERRL, EOMI HEENT: no oropharyngeal swelling Neck: trachea midline Resp: Diminished with faint wheezes with forced expiration Card: RRR, no murmurs, rubs, or gallops Abd: nontender, nondistended Extremities: no calf tenderness, no edema Vascular: 2+ radial pulses bilaterally, 2+ DP pulses bilaterally Skin: no rashes Psyc: acting appropriately   ED Results / Procedures / Treatments   Labs (all labs ordered are listed, but only abnormal results are displayed) Labs Reviewed  BASIC METABOLIC PANEL - Abnormal; Notable for the following components:      Result Value   Potassium 3.0 (*)    Calcium 8.4 (*)    All other components  within normal limits  D-DIMER, QUANTITATIVE - Abnormal; Notable for the following components:   D-Dimer, Quant 0.55 (*)    All other components within normal limits  CBG MONITORING, ED - Abnormal; Notable for the following components:   Glucose-Capillary 105 (*)    All other components within normal limits  RESP PANEL BY RT-PCR (RSV, FLU A&B, COVID)  RVPGX2  PREGNANCY, URINE  CBC  TROPONIN I (HIGH SENSITIVITY)    EKG EKG Interpretation Date/Time:  Monday November 07 2022 11:08:37 EDT Ventricular Rate:  74 PR Interval:  117 QRS Duration:  93 QT Interval:  406 QTC Calculation: 451 R Axis:   59  Text Interpretation: Sinus rhythm Borderline short PR interval Low voltage, precordial leads Confirmed by Beckey Downing [phone]) on 11/07/2022 11:43:42 AM  Radiology CT Angio Chest PE  W and/or Wo Contrast  Result Date: 11/07/2022 CLINICAL DATA:  Evaluate for pulmonary embolism. Chest wall pain and cough for 4 days. Positive D-dimer. EXAM: CT ANGIOGRAPHY CHEST WITH CONTRAST TECHNIQUE: Multidetector CT imaging of the chest was performed using the standard protocol during bolus administration of intravenous contrast. Multiplanar CT image reconstructions and MIPs were obtained to evaluate the vascular anatomy. RADIATION DOSE REDUCTION: This exam was performed according to the departmental dose-optimization program which includes automated exposure control, adjustment of the mA and/or kV according to patient size and/or use of iterative reconstruction technique. CONTRAST:  75mL OMNIPAQUE IOHEXOL 350 MG/ML SOLN COMPARISON:  None Available. FINDINGS: Cardiovascular: Satisfactory opacification of the pulmonary arteries to the segmental level. No evidence of pulmonary embolism. Normal heart size. No pericardial effusion. Mediastinum/Nodes: No enlarged mediastinal, hilar, or axillary lymph nodes. Thyroid gland, trachea, and esophagus demonstrate no significant findings. Lungs/Pleura: Mild subsegmental atelectasis  identified within the periphery of the lung bases. No pleural fluid or consolidative change. No pneumothorax. No signs of interstitial edema. No suspicious pulmonary nodules identified. Upper Abdomen: No acute abnormality. Musculoskeletal: No chest wall abnormality. No acute or significant osseous findings. Review of the MIP images confirms the above findings. IMPRESSION: 1. No evidence for acute pulmonary embolus. 2. Mild subsegmental atelectasis within the periphery of the lung bases. Electronically Signed   By: Signa Kell M.D.   On: 11/07/2022 15:16   DG Chest 2 View  Result Date: 11/07/2022 CLINICAL DATA:  Dry cough EXAM: CHEST - 2 VIEW COMPARISON:  01/18/2020 FINDINGS: Heart size is normal. Mediastinal shadows are normal. The patient has not taken a deep inspiration. Allowing for that, the lungs are clear. No infiltrate, collapse or effusion. No abnormal bone finding. IMPRESSION: No active disease. Poor inspiration. Electronically Signed   By: Paulina Fusi M.D.   On: 11/07/2022 11:57    Procedures Procedures    Medications Ordered in ED Medications  ipratropium-albuterol (DUONEB) 0.5-2.5 (3) MG/3ML nebulizer solution 3 mL (3 mLs Nebulization Given 11/07/22 1250)  iohexol (OMNIPAQUE) 350 MG/ML injection 100 mL (75 mLs Intravenous Contrast Given 11/07/22 1345)  potassium chloride SA (KLOR-CON M) CR tablet 40 mEq (40 mEq Oral Given 11/07/22 1456)    ED Course/ Medical Decision Making/ A&P                                 Medical Decision Making 37 year old female with past medical history of asthma as a child as well as anxiety presented to the emergency department today with palpitations and chest pain.  I will further evaluate patient here with basic labs as well as an EKG, D-dimer, and troponin as well as a chest x-ray to further evaluate for pulmonary edema, pulmonary infiltrates, pneumothorax, pulmonary embolism, myocarditis, or pericarditis.  Patient does have some mild wheezing here on  exam.  Will give her a DuoNeb here.  She otherwise well-appearing with stable vital signs.  Will reevaluate for ultimate disposition.  The patient's EKG interpreted by me shows a sinus rhythm with a rate 74 with normal axis, normal intervals, and nonspecific ST-T changes with some baseline artifact.  The patient's workup here is reassuring.  Her D-dimer is elevated but CT angiogram is negative.  This does show some atelectasis but no other acute findings.  I think that the patient is stable for discharge.  She has remained stable here on the monitor since she has been observed.  Her blood pressures are  in the low 100 so I will hold off on restarting her metoprolol.  She is encouraged to follow-up with her primary care doctor for reevaluation.  Amount and/or Complexity of Data Reviewed Labs: ordered. Radiology: ordered.  Risk Prescription drug management.          Final Clinical Impression(s) / ED Diagnoses Final diagnoses:  Palpitations  Bronchospasm    Rx / DC Orders ED Discharge Orders          Ordered    predniSONE (DELTASONE) 50 MG tablet        11/07/22 1527    albuterol (VENTOLIN HFA) 108 (90 Base) MCG/ACT inhaler  Every 6 hours PRN        11/07/22 1527              Durwin Glaze, MD 11/07/22 1528

## 2022-11-07 NOTE — ED Notes (Signed)
D/c paperwork reviewed with pt, including prescriptions and follow up care.  All questions and/or concerns addressed at time of d/c.  No further needs expressed. . Pt verbalized understanding, Ambulatory without assistance to ED exit, NAD.   

## 2022-11-07 NOTE — ED Triage Notes (Signed)
Pt states pcp took her off metoprolol in August and was supposed to followup with new pcp but unable to get in  States cough x4 days with chest wall pain with cough     H/o asthma but no inhaler at home

## 2022-11-07 NOTE — Discharge Instructions (Addendum)
Your workup today was reassuring.  Please follow-up with your doctor to see if they would like to do further testing or restart your metoprolol.  Does not generally medication will start from the ER without specific indications.  Please take the prednisone and use the albuterol 2 puffs every 4 hours to help with the chest pressure and shortness of breath.  Return to the ER for worsening symptoms.
